# Patient Record
Sex: Male | Born: 1955 | Race: Black or African American | Hispanic: No | Marital: Married | Smoking: Never smoker
Health system: Southern US, Community
[De-identification: ages and names within clinical notes are randomized; demographics above are authoritative.]

## PROBLEM LIST (undated history)

## (undated) DIAGNOSIS — C801 Malignant (primary) neoplasm, unspecified: Secondary | ICD-10-CM

## (undated) DIAGNOSIS — T7840XA Allergy, unspecified, initial encounter: Secondary | ICD-10-CM

## (undated) DIAGNOSIS — M1990 Unspecified osteoarthritis, unspecified site: Secondary | ICD-10-CM

## (undated) DIAGNOSIS — I10 Essential (primary) hypertension: Secondary | ICD-10-CM

## (undated) DIAGNOSIS — E7800 Pure hypercholesterolemia, unspecified: Secondary | ICD-10-CM

## (undated) HISTORY — DX: Malignant (primary) neoplasm, unspecified: C80.1

## (undated) HISTORY — DX: Essential (primary) hypertension: I10

## (undated) HISTORY — DX: Allergy, unspecified, initial encounter: T78.40XA

## (undated) HISTORY — PX: PROSTATECTOMY: SHX69

---

## 2004-02-02 ENCOUNTER — Ambulatory Visit: Payer: Self-pay | Admitting: Internal Medicine

## 2004-05-03 ENCOUNTER — Ambulatory Visit: Payer: Self-pay | Admitting: Internal Medicine

## 2004-10-18 ENCOUNTER — Ambulatory Visit: Payer: Self-pay | Admitting: Internal Medicine

## 2005-04-20 ENCOUNTER — Ambulatory Visit: Payer: Self-pay | Admitting: Internal Medicine

## 2005-07-18 ENCOUNTER — Ambulatory Visit: Payer: Self-pay | Admitting: Internal Medicine

## 2006-01-09 ENCOUNTER — Ambulatory Visit: Payer: Self-pay | Admitting: Internal Medicine

## 2006-04-04 ENCOUNTER — Ambulatory Visit: Payer: Self-pay | Admitting: Internal Medicine

## 2006-07-10 ENCOUNTER — Ambulatory Visit: Payer: Self-pay | Admitting: Internal Medicine

## 2006-07-10 LAB — CONVERTED CEMR LAB
AST: 28 units/L (ref 0–37)
Albumin: 3.8 g/dL (ref 3.5–5.2)
BUN: 13 mg/dL (ref 6–23)
Basophils Absolute: 0 10*3/uL (ref 0.0–0.1)
Basophils Relative: 0.7 % (ref 0.0–1.0)
Bilirubin, Direct: 0.1 mg/dL (ref 0.0–0.3)
CO2: 33 meq/L — ABNORMAL HIGH (ref 19–32)
Cholesterol: 236 mg/dL (ref 0–200)
Creatinine, Ser: 1 mg/dL (ref 0.4–1.5)
Eosinophils Absolute: 0.3 10*3/uL (ref 0.0–0.6)
Eosinophils Relative: 6.4 % — ABNORMAL HIGH (ref 0.0–5.0)
GFR calc Af Amer: 102 mL/min
Glucose, Bld: 124 mg/dL — ABNORMAL HIGH (ref 70–99)
Lymphocytes Relative: 48.3 % — ABNORMAL HIGH (ref 12.0–46.0)
Monocytes Relative: 10.3 % (ref 3.0–11.0)
Neutrophils Relative %: 34.3 % — ABNORMAL LOW (ref 43.0–77.0)
RBC: 5.09 M/uL (ref 4.22–5.81)
Sodium: 142 meq/L (ref 135–145)
TSH: 1.79 microintl units/mL (ref 0.35–5.50)
Total CHOL/HDL Ratio: 5.6
Triglycerides: 124 mg/dL (ref 0–149)
VLDL: 25 mg/dL (ref 0–40)

## 2006-08-16 ENCOUNTER — Ambulatory Visit: Payer: Self-pay | Admitting: Internal Medicine

## 2006-09-11 ENCOUNTER — Ambulatory Visit: Payer: Self-pay | Admitting: Internal Medicine

## 2006-09-25 ENCOUNTER — Ambulatory Visit: Payer: Self-pay | Admitting: Internal Medicine

## 2007-02-15 DIAGNOSIS — J309 Allergic rhinitis, unspecified: Secondary | ICD-10-CM | POA: Insufficient documentation

## 2007-02-15 DIAGNOSIS — E785 Hyperlipidemia, unspecified: Secondary | ICD-10-CM | POA: Insufficient documentation

## 2007-02-15 DIAGNOSIS — I10 Essential (primary) hypertension: Secondary | ICD-10-CM | POA: Insufficient documentation

## 2007-02-15 DIAGNOSIS — K5730 Diverticulosis of large intestine without perforation or abscess without bleeding: Secondary | ICD-10-CM | POA: Insufficient documentation

## 2007-04-04 ENCOUNTER — Ambulatory Visit: Payer: Self-pay | Admitting: Internal Medicine

## 2007-04-11 ENCOUNTER — Ambulatory Visit: Payer: Self-pay | Admitting: Internal Medicine

## 2007-04-11 DIAGNOSIS — M109 Gout, unspecified: Secondary | ICD-10-CM | POA: Insufficient documentation

## 2007-04-11 LAB — CONVERTED CEMR LAB: Uric Acid, Serum: 7.9 mg/dL — ABNORMAL HIGH (ref 2.4–7.0)

## 2007-05-06 ENCOUNTER — Telehealth: Payer: Self-pay | Admitting: Internal Medicine

## 2008-05-04 ENCOUNTER — Ambulatory Visit: Payer: Self-pay | Admitting: Internal Medicine

## 2008-05-04 DIAGNOSIS — Z8719 Personal history of other diseases of the digestive system: Secondary | ICD-10-CM | POA: Insufficient documentation

## 2008-07-21 ENCOUNTER — Ambulatory Visit: Payer: Self-pay | Admitting: Family Medicine

## 2008-07-21 DIAGNOSIS — L538 Other specified erythematous conditions: Secondary | ICD-10-CM | POA: Insufficient documentation

## 2008-08-25 ENCOUNTER — Ambulatory Visit: Payer: Self-pay | Admitting: Internal Medicine

## 2008-08-25 LAB — CONVERTED CEMR LAB
ALT: 29 units/L (ref 0–53)
BUN: 11 mg/dL (ref 6–23)
Basophils Absolute: 0 10*3/uL (ref 0.0–0.1)
Bilirubin Urine: NEGATIVE
Blood in Urine, dipstick: NEGATIVE
Calcium: 9.5 mg/dL (ref 8.4–10.5)
Chloride: 103 meq/L (ref 96–112)
Creatinine, Ser: 1 mg/dL (ref 0.4–1.5)
GFR calc non Af Amer: 100.86 mL/min (ref >60)
Glucose, Bld: 141 mg/dL — ABNORMAL HIGH (ref 70–99)
HCT: 40.3 % (ref 39.0–52.0)
HDL: 39 mg/dL — ABNORMAL LOW (ref >39.00)
Lymphocytes Relative: 46.3 % — ABNORMAL HIGH (ref 12.0–46.0)
Lymphs Abs: 1.9 10*3/uL (ref 0.7–4.0)
MCHC: 34.3 g/dL (ref 30.0–36.0)
MCV: 84.1 fL (ref 78.0–100.0)
Monocytes Absolute: 0.3 10*3/uL (ref 0.1–1.0)
Neutro Abs: 1.6 10*3/uL (ref 1.4–7.7)
Neutrophils Relative %: 40 % — ABNORMAL LOW (ref 43.0–77.0)
PSA: 4.27 ng/mL — ABNORMAL HIGH (ref 0.10–4.00)
RBC: 4.8 M/uL (ref 4.22–5.81)
RDW: 14.7 % — ABNORMAL HIGH (ref 11.5–14.6)
Total Protein: 7.8 g/dL (ref 6.0–8.3)
Urobilinogen, UA: 0.2
WBC: 4 10*3/uL — ABNORMAL LOW (ref 4.5–10.5)

## 2008-09-01 ENCOUNTER — Ambulatory Visit: Payer: Self-pay | Admitting: Internal Medicine

## 2008-09-01 DIAGNOSIS — R972 Elevated prostate specific antigen [PSA]: Secondary | ICD-10-CM | POA: Insufficient documentation

## 2008-09-17 ENCOUNTER — Encounter: Payer: Self-pay | Admitting: Internal Medicine

## 2008-10-13 ENCOUNTER — Ambulatory Visit: Payer: Self-pay | Admitting: Internal Medicine

## 2008-10-26 ENCOUNTER — Encounter: Payer: Self-pay | Admitting: Internal Medicine

## 2008-11-11 ENCOUNTER — Encounter: Payer: Self-pay | Admitting: Internal Medicine

## 2008-12-24 ENCOUNTER — Encounter (INDEPENDENT_AMBULATORY_CARE_PROVIDER_SITE_OTHER): Payer: Self-pay | Admitting: Urology

## 2008-12-24 ENCOUNTER — Inpatient Hospital Stay (HOSPITAL_COMMUNITY): Admission: RE | Admit: 2008-12-24 | Discharge: 2008-12-25 | Payer: Self-pay | Admitting: Urology

## 2009-01-01 ENCOUNTER — Encounter: Payer: Self-pay | Admitting: Internal Medicine

## 2009-02-17 ENCOUNTER — Encounter (INDEPENDENT_AMBULATORY_CARE_PROVIDER_SITE_OTHER): Payer: Self-pay | Admitting: *Deleted

## 2009-02-22 ENCOUNTER — Ambulatory Visit: Payer: Self-pay | Admitting: Internal Medicine

## 2009-02-22 LAB — CONVERTED CEMR LAB
Blood Glucose, Fingerstick: 96
Hgb A1c MFr Bld: 6.5 % (ref 4.6–6.5)

## 2009-05-24 ENCOUNTER — Encounter: Payer: Self-pay | Admitting: Internal Medicine

## 2009-06-08 ENCOUNTER — Ambulatory Visit: Payer: Self-pay | Admitting: Internal Medicine

## 2009-06-08 DIAGNOSIS — M549 Dorsalgia, unspecified: Secondary | ICD-10-CM | POA: Insufficient documentation

## 2009-06-08 LAB — CONVERTED CEMR LAB: Blood Glucose, Fingerstick: 108

## 2009-06-23 ENCOUNTER — Encounter: Payer: Self-pay | Admitting: Internal Medicine

## 2009-08-24 ENCOUNTER — Ambulatory Visit: Payer: Self-pay | Admitting: Internal Medicine

## 2009-08-24 DIAGNOSIS — M545 Low back pain, unspecified: Secondary | ICD-10-CM | POA: Insufficient documentation

## 2009-08-24 DIAGNOSIS — E119 Type 2 diabetes mellitus without complications: Secondary | ICD-10-CM | POA: Insufficient documentation

## 2009-08-24 DIAGNOSIS — K439 Ventral hernia without obstruction or gangrene: Secondary | ICD-10-CM | POA: Insufficient documentation

## 2009-08-26 ENCOUNTER — Encounter: Admission: RE | Admit: 2009-08-26 | Discharge: 2009-08-26 | Payer: Self-pay | Admitting: Internal Medicine

## 2009-09-01 ENCOUNTER — Telehealth: Payer: Self-pay | Admitting: Internal Medicine

## 2009-09-03 ENCOUNTER — Encounter: Admission: RE | Admit: 2009-09-03 | Discharge: 2009-09-03 | Payer: Self-pay | Admitting: Internal Medicine

## 2009-09-06 ENCOUNTER — Telehealth: Payer: Self-pay | Admitting: Internal Medicine

## 2009-11-22 ENCOUNTER — Ambulatory Visit: Payer: Self-pay | Admitting: Internal Medicine

## 2009-11-22 LAB — CONVERTED CEMR LAB
Blood Glucose, Fingerstick: 126
Hgb A1c MFr Bld: 7.1 % — ABNORMAL HIGH (ref 4.6–6.5)

## 2009-12-22 ENCOUNTER — Encounter: Payer: Self-pay | Admitting: Internal Medicine

## 2010-02-22 ENCOUNTER — Ambulatory Visit: Payer: Self-pay | Admitting: Internal Medicine

## 2010-02-24 LAB — CONVERTED CEMR LAB: Hgb A1c MFr Bld: 7.3 % — ABNORMAL HIGH (ref 4.6–6.5)

## 2010-04-12 NOTE — Progress Notes (Signed)
Summary: wants sedative -rx for lorazepam  Phone Note Call from Patient Call back at Work Phone [phone]   Caller: Patient---live call Reason for Call: Talk to Doctor Summary of Call: Having a MRI on this Friday morning. he stated that he is claustrophobic and  will need a sedative to help him relax. Please call in a rx to [facility] on [address]. Please call pt when done. Initial call taken by: Warnell Forester,  September 01, 2009 10:33 AM  Follow-up for Phone Call        lorazepam 1 mg  #3  Take 30 minutes (one) prior to procedure Follow-up by: Gordy Savers  MD,  September 01, 2009 12:20 PM  Additional Follow-up for Phone Call Additional follow up Details #1::        med rx'd to [facility] - pt aware how to take. KIK Additional Follow-up by: Duard Brady LPN,  September 01, 2009 1:01 PM    New/Updated Medications: LORAZEPAM 1 MG TABS (LORAZEPAM) 1 by mouth 30 mins before procsdure Prescriptions: LORAZEPAM 1 MG TABS (LORAZEPAM) 1 by mouth 30 mins before procsdure  #3 x 0   Entered by:   Duard Brady LPN   Authorized by:   Gordy Savers  MD   Signed by:   Duard Brady LPN on 25/95/6387   Method used:   Historical   RxID:   [id number]

## 2010-04-12 NOTE — Assessment & Plan Note (Signed)
Summary: 3 month rov/nrj   Vital Signs:  Patient profile:   56 year old male Weight:      245 pounds Temp:     98.1 degrees F oral BP sitting:   120 / 84  (left arm) Cuff size:   regular  Vitals Entered By: Duard Brady LPN (November 22, 2009 12:38 PM) CC: 3 mos rov - doing ok Is Patient Diabetic? Yes Did you bring your meter with you today? No CBG Result 126 Flu Vaccine Consent Questions     Do you have a history of severe allergic reactions to this vaccine? no    Any prior history of allergic reactions to egg and/or gelatin? no    Do you have a sensitivity to the preservative Thimersol? no    Do you have a past history of Guillan-Barre Syndrome? no    Do you currently have an acute febrile illness? no    Have you ever had a severe reaction to latex? no    Vaccine information given and explained to patient? yes    Are you currently pregnant? no    Lot Number:[id number]   Exp Date:09/10/2010   Site Given  Left Deltoid IM   CC:  3 mos rov - doing ok.  History of Present Illness: 55 year old patient who is seen today for follow-up of type 2 diabetes, hypertension, dyslipidemia, and through remains on Crestor 40 mg daily.  Three months ago.  He presented with right back pain.  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  His diabetes remained stable in spite of modest weight gain.  His blood pressure remained stable.  No further back pain  Allergies (verified): No Known Drug Allergies  Past History:  Past Medical History: Reviewed history from 08/24/2009 and no changes required. Allergic rhinitis Hyperlipidemia Hypertension Diverticulosis, colon hypokalemia Elevated PSA/prostate cancer Low back pain Diabetes mellitus, type II  Past Surgical History: Reviewed history from 02/22/2009 and no changes required. Denies surgical history colonoscopy 2008 radical prostatectomy October 2010  Review of Systems       The patient complains of weight  gain.  The patient denies anorexia, fever, weight loss, vision loss, decreased hearing, hoarseness, chest pain, syncope, dyspnea on exertion, peripheral edema, prolonged cough, headaches, hemoptysis, abdominal pain, melena, hematochezia, severe indigestion/heartburn, hematuria, incontinence, genital sores, muscle weakness, suspicious skin lesions, transient blindness, difficulty walking, depression, unusual weight change, abnormal bleeding, enlarged lymph nodes, angioedema, breast masses, and testicular masses.    Physical Exam  General:  overweight-appearing.  120/76overweight-appearing.   Head:  Normocephalic and atraumatic without obvious abnormalities. No apparent alopecia or balding. Eyes:  No corneal or conjunctival inflammation noted. EOMI. Perrla. Funduscopic exam benign, without hemorrhages, exudates or papilledema. Vision grossly normal. Mouth:  Oral mucosa and oropharynx without lesions or exudates.  Teeth in good repair. Neck:  No deformities, masses, or tenderness noted. Lungs:  Normal respiratory effort, chest expands symmetrically. Lungs are clear to auscultation, no crackles or wheezes. Heart:  Normal rate and regular rhythm. S1 and S2 normal without gallop, murmur, click, rub or other extra sounds. Abdomen:  Bowel sounds positive,abdomen soft and non-tender without masses, organomegaly or hernias noted. Msk:  No deformity or scoliosis noted of thoracic or lumbar spine.   Pulses:  R and L carotid,radial,femoral,dorsalis pedis and posterior tibial pulses are full and equal bilaterally  Diabetes Management Exam:    Foot Exam (with socks and/or shoes not present):       Sensory-Pinprick/Light touch:  Left medial foot (L-4): normal          Left dorsal foot (L-5): normal          Left lateral foot (S-1): normal          Right medial foot (L-4): normal          Right dorsal foot (L-5): normal          Right lateral foot (S-1): normal       Sensory-Monofilament:           Left foot: normal          Right foot: normal       Inspection:          Left foot: normal          Right foot: normal       Nails:          Left foot: normal          Right foot: normal    Foot Exam by Podiatrist:       Date: 11/22/2009       Results: no diabetic findings       Done by: PCP   Impression & Recommendations:  Problem # 1:  DIABETES MELLITUS, TYPE II (ICD-250.00)  His updated medication list for this problem includes:    Benazepril-hydrochlorothiazide 20-25 Mg Tabs (Benazepril-hydrochlorothiazide) ..... One daily    Metformin Hcl 500 Mg Tabs (Metformin hcl) ..... One twice daily  Orders: Capillary Blood Glucose/CBG (74259)  His updated medication list for this problem includes:    Benazepril-hydrochlorothiazide 20-25 Mg Tabs (Benazepril-hydrochlorothiazide) ..... One daily    Metformin Hcl 500 Mg Tabs (Metformin hcl) ..... One twice daily  Problem # 2:  HYPERLIPIDEMIA (ICD-272.4)  His updated medication list for this problem includes:    Crestor 40 Mg Tabs (Rosuvastatin calcium) ..... One daily  His updated medication list for this problem includes:    Crestor 40 Mg Tabs (Rosuvastatin calcium) ..... One daily  Problem # 3:  HYPERTENSION (ICD-401.9)  His updated medication list for this problem includes:    Amlodipine Besylate 10 Mg Tabs (Amlodipine besylate) ..... One daily    Benazepril-hydrochlorothiazide 20-25 Mg Tabs (Benazepril-hydrochlorothiazide) ..... One daily  His updated medication list for this problem includes:    Amlodipine Besylate 10 Mg Tabs (Amlodipine besylate) ..... One daily    Benazepril-hydrochlorothiazide 20-25 Mg Tabs (Benazepril-hydrochlorothiazide) ..... One daily  Complete Medication List: 1)  Klor-con M20 20 Meq Tbcr (Potassium chloride crys cr) .... 2 once daily 2)  Flonase 50 Mcg/act Susp (Fluticasone propionate) .... Use once daily as needed 3)  Amlodipine Besylate 10 Mg Tabs (Amlodipine besylate) .... One daily 4)   Colchicine 0.6 Mg Tabs (Colchicine) .... One three times daily with meals 5)  Benazepril-hydrochlorothiazide 20-25 Mg Tabs (Benazepril-hydrochlorothiazide) .... One daily 6)  Metformin Hcl 500 Mg Tabs (Metformin hcl) .... One twice daily 7)  Crestor 40 Mg Tabs (Rosuvastatin calcium) .... One daily 8)  Cialis 5 Mg Tabs (Tadalafil) .... Take as dir 9)  Diclofenac Sodium 75 Mg Tbec (Diclofenac sodium) .... One twice daily as needed for low back pain 10)  Cyclobenzaprine Hcl 10 Mg Tabs (Cyclobenzaprine hcl) .... One at bedtime 11)  Hydrocodone-acetaminophen 5-500 Mg Tabs (Hydrocodone-acetaminophen) .... One every 6 hours as needed for pain  Other Orders: Admin 1st Vaccine (56387) Flu Vaccine 61yrs + (56433) Venipuncture (29518) TLB-A1C / Hgb A1C (Glycohemoglobin) (83036-A1C) Specimen Handling (84166)  Patient Instructions: 1)  Please schedule a follow-up appointment  in 3 months. 2)  It is important that you exercise regularly at least 20 minutes 5 times a week. If you develop chest pain, have severe difficulty breathing, or feel very tired , stop exercising immediately and seek medical attention. 3)  You need to lose weight. Consider a lower calorie diet and regular exercise.  4)  Check your blood sugars regularly. If your readings are usually above : or below 70 you should contact our office. 5)  It is important that your Diabetic A1c level is checked every 3 months. 6)  See your eye doctor yearly to check for diabetic eye damage.

## 2010-04-12 NOTE — Letter (Signed)
Summary: [facility]  [facility]   Imported By: Maryln Gottron 01/03/2010 15:25:32  _____________________________________________________________________  External Attachment:    Type:   Image     Comment:   External Document

## 2010-04-12 NOTE — Assessment & Plan Note (Signed)
Summary: EVAL OF POSSIBLE HERNIA // RS  I and a in the he is a  Vital Signs:  Patient profile:   55 year old male Weight:      239 pounds Temp:     98.2 degrees F oral BP sitting:   110 / 70  (left arm) Cuff size:   regular  Vitals Entered By: Duard Brady LPN (August 24, 2009 3:55 PM) CC: c/o back pain , ?abd hernia Is Patient Diabetic? Yes Did you bring your meter with you today? No   CC:  c/o back pain  and ?abd hernia.  History of Present Illness: 55 year old patient who is seen today for follow-up of low back pain.  He has had this for approximately 3 months.  He has had some intermittent pain also in the past.  Pain is primarily right-sided without much radicular component.  He has a new and some gentle stretching and back exercises without benefit.  He is been on anti-inflammatory medicines for 3 months .He also complains of a small periumbilical hernia that has become slightly more painful. He has diabetes and hypertension, which have been stable  Allergies (verified): No Known Drug Allergies  Past History:  Past Medical History: Allergic rhinitis Hyperlipidemia Hypertension Diverticulosis, colon hypokalemia Elevated PSA/prostate cancer Low back pain Diabetes mellitus, type II  Review of Systems       The patient complains of abdominal pain.  The patient denies anorexia, fever, weight loss, weight gain, vision loss, decreased hearing, hoarseness, chest pain, syncope, dyspnea on exertion, peripheral edema, prolonged cough, headaches, hemoptysis, melena, hematochezia, severe indigestion/heartburn, hematuria, incontinence, genital sores, muscle weakness, suspicious skin lesions, transient blindness, difficulty walking, depression, unusual weight change, abnormal bleeding, enlarged lymph nodes, angioedema, breast masses, and testicular masses.    Physical Exam  General:  overweight-appearing.  blood pressure nicely controlledoverweight-appearing.   Eyes:  No  corneal or conjunctival inflammation noted. EOMI. Perrla. Funduscopic exam benign, without hemorrhages, exudates or papilledema. Vision grossly normal. Abdomen:  a small midline abdominal hernia noted just cephalad to the umbilical area Msk:  slight tenderness in the right lumbar region.  Straight leg testing was negative.  Reflexes and motor examination, unremarkable  Diabetes Management Exam:    Eye Exam:       Eye Exam done here today          Results: normal   Impression & Recommendations:  Problem # 1:  DIABETES MELLITUS, TYPE II (ICD-250.00)  His updated medication list for this problem includes:    Benazepril-hydrochlorothiazide 20-25 Mg Tabs (Benazepril-hydrochlorothiazide) ..... One daily    Metformin Hcl 500 Mg Tabs (Metformin hcl) ..... One twice daily  His updated medication list for this problem includes:    Benazepril-hydrochlorothiazide 20-25 Mg Tabs (Benazepril-hydrochlorothiazide) ..... One daily    Metformin Hcl 500 Mg Tabs (Metformin hcl) ..... One twice daily  Problem # 2:  LOW BACK PAIN (ICD-724.2)  His updated medication list for this problem includes:    Diclofenac Sodium 75 Mg Tbec (Diclofenac sodium) ..... One twice daily as needed for low back pain    Cyclobenzaprine Hcl 10 Mg Tabs (Cyclobenzaprine hcl) ..... One at bedtime    Hydrocodone-acetaminophen 5-500 Mg Tabs (Hydrocodone-acetaminophen) ..... One every 6 hours as needed for pain    His updated medication list for this problem includes:    Diclofenac Sodium 75 Mg Tbec (Diclofenac sodium) ..... One twice daily as needed for low back pain    Cyclobenzaprine Hcl 10 Mg Tabs (Cyclobenzaprine hcl) .Marland KitchenMarland KitchenMarland KitchenMarland Kitchen  One at bedtime    Hydrocodone-acetaminophen 5-500 Mg Tabs (Hydrocodone-acetaminophen) ..... One every 6 hours as needed for pain  Orders: Radiology Referral (Radiology)  Problem # 3:  HYPERTENSION (ICD-401.9)  His updated medication list for this problem includes:    Amlodipine Besylate 10 Mg Tabs  (Amlodipine besylate) ..... One daily    Benazepril-hydrochlorothiazide 20-25 Mg Tabs (Benazepril-hydrochlorothiazide) ..... One daily  His updated medication list for this problem includes:    Amlodipine Besylate 10 Mg Tabs (Amlodipine besylate) ..... One daily    Benazepril-hydrochlorothiazide 20-25 Mg Tabs (Benazepril-hydrochlorothiazide) ..... One daily  Problem # 4:  VENTRAL HERNIA (ICD-553.20)  Complete Medication List: 1)  Klor-con M20 20 Meq Tbcr (Potassium chloride crys cr) .... 2 once daily 2)  Flonase 50 Mcg/act Susp (Fluticasone propionate) .... Use once daily as needed 3)  Amlodipine Besylate 10 Mg Tabs (Amlodipine besylate) .... One daily 4)  Colchicine 0.6 Mg Tabs (Colchicine) .... One three times daily with meals 5)  Benazepril-hydrochlorothiazide 20-25 Mg Tabs (Benazepril-hydrochlorothiazide) .... One daily 6)  Metformin Hcl 500 Mg Tabs (Metformin hcl) .... One twice daily 7)  Crestor 40 Mg Tabs (Rosuvastatin calcium) .... One daily 8)  Cialis 5 Mg Tabs (Tadalafil) .... Take as dir 9)  Diclofenac Sodium 75 Mg Tbec (Diclofenac sodium) .... One twice daily as needed for low back pain 10)  Cyclobenzaprine Hcl 10 Mg Tabs (Cyclobenzaprine hcl) .... One at bedtime 11)  Hydrocodone-acetaminophen 5-500 Mg Tabs (Hydrocodone-acetaminophen) .... One every 6 hours as needed for pain  Patient Instructions: 1)  Please schedule a follow-up appointment in 3 months. 2)  Limit your Sodium (Salt). 3)  lumbar MRI as scheduled 4)  call if  abdominal pain, or hernia worsens Prescriptions: HYDROCODONE-ACETAMINOPHEN 5-500 MG TABS (HYDROCODONE-ACETAMINOPHEN) one every 6 hours as needed for pain  #50 x 2   Entered and Authorized by:   Gordy Savers  MD   Signed by:   Gordy Savers  MD on 08/24/2009   Method used:   Print then Give to Patient   RxID:   [id number] CYCLOBENZAPRINE HCL 10 MG TABS (CYCLOBENZAPRINE HCL) one at bedtime  #50 x 2   Entered and Authorized by:   Gordy Savers  MD   Signed by:   Gordy Savers  MD on 08/24/2009   Method used:   Print then Give to Patient   RxID:   [id number]

## 2010-04-12 NOTE — Letter (Signed)
Summary: [facility]  [facility]   Imported By: Maryln Gottron 07/06/2009 14:50:24  _____________________________________________________________________  External Attachment:    Type:   Image     Comment:   External Document

## 2010-04-12 NOTE — Assessment & Plan Note (Signed)
Summary: 4 month fup//ccm--PT [facility] //RS   Vital Signs:  Patient profile:   55 year old male Weight:      235 pounds Temp:     98.1 degrees F oral BP sitting:   106 / 80  (right arm) Cuff size:   large  Vitals Entered By: Duard Brady LPN (June 08, 2009 10:45 AM) CC: c/o lower back pain , no fall or injury Is Patient Diabetic? Yes Did you bring your meter with you today? No CBG Result 108   CC:  c/o lower back pain  and no fall or injury.  History of Present Illness: a 55 year old patient who is seen today with a two week history of low back pain.  This is aggravated by standing from a sitting position and movement.  No radicular symptoms.  No prior history of low back pain.  He is seen today also for follow-up of his hypertension, dyslipidemia, and type 2 diabetes.  Otherwise, is doing quite well.  A random blood sugar today 108, blood pressure has well-controlled.  He remains on Crestor 40 milligrams daily, which he continues to tolerate without difficulty.  He is a history of gout, which has been stable.  He is status post prostatectomy last year  Preventive Screening-Counseling & Management  Alcohol-Tobacco     Smoking Status: never  Allergies (verified): No Known Drug Allergies  Past History:  Past Medical History: Allergic rhinitis Hyperlipidemia Hypertension Diverticulosis, colon hypokalemia IGT Elevated PSA/prostate cancer  Past Surgical History: Reviewed history from 02/22/2009 and no changes required. Denies surgical history colonoscopy 2008 radical prostatectomy October 2010  Family History: Reviewed history from 09/01/2008 and no changes required. Father Age 39:  DM, HTN Mother:  age 16- HTN  5 [place]- Died Pneumonia (1); HTN 1 Sister- HTN  Review of Systems       The patient complains of weight gain.  The patient denies anorexia, fever, weight loss, vision loss, decreased hearing, hoarseness, chest pain, syncope, dyspnea on exertion,  peripheral edema, prolonged cough, headaches, hemoptysis, abdominal pain, melena, hematochezia, severe indigestion/heartburn, hematuria, incontinence, genital sores, muscle weakness, suspicious skin lesions, transient blindness, difficulty walking, depression, unusual weight change, abnormal bleeding, enlarged lymph nodes, angioedema, breast masses, and testicular masses.    Physical Exam  General:  overweight-appearing.  120/76 Head:  Normocephalic and atraumatic without obvious abnormalities. No apparent alopecia or balding. Eyes:  No corneal or conjunctival inflammation noted. EOMI. Perrla. Funduscopic exam benign, without hemorrhages, exudates or papilledema. Vision grossly normal. Mouth:  Oral mucosa and oropharynx without lesions or exudates.  Teeth in good repair. Neck:  No deformities, masses, or tenderness noted. Lungs:  Normal respiratory effort, chest expands symmetrically. Lungs are clear to auscultation, no crackles or wheezes. Heart:  Normal rate and regular rhythm. S1 and S2 normal without gallop, murmur, click, rub or other extra sounds. Abdomen:  Bowel sounds positive,abdomen soft and non-tender without masses, organomegaly or hernias noted. Msk:  no lumbar tenderness.  Straight leg testing negative.  Neurovascular structures intact Pulses:  R and L carotid,radial,femoral,dorsalis pedis and posterior tibial pulses are full and equal bilaterally Extremities:  No clubbing, cyanosis, edema, or deformity noted with normal full range of motion of all joints.     Impression & Recommendations:  Problem # 1:  BACK PAIN (ICD-724.5)  His updated medication list for this problem includes:    Diclofenac Sodium 75 Mg Tbec (Diclofenac sodium) ..... One twice daily as needed for low back pain  Problem # 2:  IMPAIRED  GLUCOSE TOLERANCE (ICD-271.3)  Orders: Venipuncture (30865) TLB-A1C / Hgb A1C (Glycohemoglobin) (83036-A1C)  Problem # 3:  HYPERTENSION (ICD-401.9)  His updated  medication list for this problem includes:    Amlodipine Besylate 10 Mg Tabs (Amlodipine besylate) ..... One daily    Benazepril-hydrochlorothiazide 20-25 Mg Tabs (Benazepril-hydrochlorothiazide) ..... One daily  Complete Medication List: 1)  Klor-con M20 20 Meq Tbcr (Potassium chloride crys cr) .... 2 once daily 2)  Flonase 50 Mcg/act Susp (Fluticasone propionate) .... Use once daily as needed 3)  Amlodipine Besylate 10 Mg Tabs (Amlodipine besylate) .... One daily 4)  Colchicine 0.6 Mg Tabs (Colchicine) .... One three times daily with meals 5)  Lotrisone 1-0.05 % Crea (Clotrimazole-betamethasone) .... Apply to affected rash two times a day as needed 6)  Benazepril-hydrochlorothiazide 20-25 Mg Tabs (Benazepril-hydrochlorothiazide) .... One daily 7)  Metformin Hcl 500 Mg Tabs (Metformin hcl) .... One twice daily 8)  Crestor 40 Mg Tabs (Rosuvastatin calcium) .... One daily 9)  Cialis 5 Mg Tabs (Tadalafil) .... Take as dir 10)  Diclofenac Sodium 75 Mg Tbec (Diclofenac sodium) .... One twice daily as needed for low back pain  Other Orders: Capillary Blood Glucose/CBG (78469)  Patient Instructions: 1)  Please schedule a follow-up appointment in 4 months. 2)  Limit your Sodium (Salt). 3)  It is important that you exercise regularly at least 20 minutes 5 times a week. If you develop chest pain, have severe difficulty breathing, or feel very tired , stop exercising immediately and seek medical attention. 4)  You need to lose weight. Consider a lower calorie diet and regular exercise.  5)  Check your blood sugars regularly. If your readings are usually above : or below 70 you should contact our office. 6)  It is important that your Diabetic A1c level is checked every 3 months. 7)  See your eye doctor yearly to check for diabetic eye damage. 8)  Most patients (90%) with low back pain will improve with time (2-6 weeks). Keep active but avoid activities that are painful. Apply moist heat  to lower  back several times a day. Prescriptions: DICLOFENAC SODIUM 75 MG TBEC (DICLOFENAC SODIUM) one twice daily as needed for low back pain  #50 x 2   Entered and Authorized by:   Gordy Savers  MD   Signed by:   Gordy Savers  MD on 06/08/2009   Method used:   Print then Mail to Patient   RxID:   [id number]

## 2010-04-12 NOTE — Progress Notes (Signed)
Summary: MRI results  Phone Note Call from Patient   Caller: Patient Call For: Gordy Savers  MD Summary of Call: Pt is calling for MRI results. [phone] Initial call taken by: Lynann Beaver CMA,  September 06, 2009 12:49 PM  Follow-up for Phone Call        called Follow-up by: Gordy Savers  MD,  September 06, 2009 1:02 PM

## 2010-04-12 NOTE — Letter (Signed)
Summary: Eye Examination/[facility]  Eye Examination/[facility]   Imported By: Maryln Gottron 05/26/2009 13:01:06  _____________________________________________________________________  External Attachment:    Type:   Image     Comment:   External Document

## 2010-04-14 NOTE — Assessment & Plan Note (Signed)
Summary: ROA/FUP/A1C/RCD---PT [facility] // RS   Vital Signs:  Patient profile:   55 year old male Weight:      248 pounds Temp:     98.4 degrees F oral BP sitting:   124 / 78  (left arm) Cuff size:   regular  Vitals Entered By: Duard Brady LPN (February 24, 2010 8:18 AM) CC: rov- doing ok Is Patient Diabetic? Yes CBG Result 149   CC:  rov- doing ok.  History of Present Illness:  55 year old patient who is seen today for follow-up of his type 2 diabetes and hypertension.  He is followed by urology.  Status post radical prostatectomy.  Assessment PSAs have been nondetectable.  He also has dyslipidemia.  No concerns or complaints today.  His last hemoglobin A1c7.1.  There has been some modest weight gain since his last visit here. Medical regimen includes Crestor 40 mg daily, which she continues to tolerate well  Allergies (verified): No Known Drug Allergies  Past History:  Past Medical History: Reviewed history from 08/24/2009 and no changes required. Allergic rhinitis Hyperlipidemia Hypertension Diverticulosis, colon hypokalemia Elevated PSA/prostate cancer Low back pain Diabetes mellitus, type II  Past Surgical History: Reviewed history from 02/22/2009 and no changes required. Denies surgical history colonoscopy 2008 radical prostatectomy October 2010  Review of Systems       The patient complains of weight gain.  The patient denies anorexia, fever, weight loss, vision loss, decreased hearing, hoarseness, chest pain, syncope, dyspnea on exertion, peripheral edema, prolonged cough, headaches, hemoptysis, abdominal pain, melena, hematochezia, severe indigestion/heartburn, hematuria, incontinence, genital sores, muscle weakness, suspicious skin lesions, transient blindness, difficulty walking, depression, unusual weight change, abnormal bleeding, enlarged lymph nodes, angioedema, breast masses, and testicular masses.    Physical Exam  General:  overweight-appearing.   124/76overweight-appearing.   Head:  Normocephalic and atraumatic without obvious abnormalities. No apparent alopecia or balding. Eyes:  No corneal or conjunctival inflammation noted. EOMI. Perrla. Funduscopic exam benign, without hemorrhages, exudates or papilledema. Vision grossly normal. Mouth:  Oral mucosa and oropharynx without lesions or exudates.  Teeth in good repair. Neck:  No deformities, masses, or tenderness noted. Lungs:  Normal respiratory effort, chest expands symmetrically. Lungs are clear to auscultation, no crackles or wheezes. Heart:  Normal rate and regular rhythm. S1 and S2 normal without gallop, murmur, click, rub or other extra sounds. Abdomen:  Bowel sounds positive,abdomen soft and non-tender without masses, organomegaly or hernias noted. Msk:  No deformity or scoliosis noted of thoracic or lumbar spine.   Pulses:  R and L carotid,radial,femoral,dorsalis pedis and posterior tibial pulses are full and equal bilaterally Extremities:  No clubbing, cyanosis, edema, or deformity noted with normal full range of motion of all joints.     Impression & Recommendations:  Problem # 1:  DIABETES MELLITUS, TYPE II (ICD-250.00)  The following medications were removed from the medication list:    Metformin Hcl 500 Mg Tabs (Metformin hcl) ..... One twice daily His updated medication list for this problem includes:    Benazepril-hydrochlorothiazide 20-25 Mg Tabs (Benazepril-hydrochlorothiazide) ..... One daily    Metformin Hcl 500 Mg Xr24h-tab (Metformin hcl) .Marland Kitchen..Marland Kitchen Two daily  Orders: Capillary Blood Glucose/CBG (04540) Venipuncture (98119) TLB-A1C / Hgb A1C (Glycohemoglobin) (83036-A1C) Specimen Handling (14782)  The following medications were removed from the medication list:    Metformin Hcl 500 Mg Tabs (Metformin hcl) ..... One twice daily His updated medication list for this problem includes:    Benazepril-hydrochlorothiazide 20-25 Mg Tabs (Benazepril-hydrochlorothiazide)  ..... One daily  Metformin Hcl 500 Mg Xr24h-tab (Metformin hcl) .Marland Kitchen..Marland Kitchen Two daily  Problem # 2:  HYPERTENSION (ICD-401.9)  His updated medication list for this problem includes:    Amlodipine Besylate 10 Mg Tabs (Amlodipine besylate) ..... One daily    Benazepril-hydrochlorothiazide 20-25 Mg Tabs (Benazepril-hydrochlorothiazide) ..... One daily  His updated medication list for this problem includes:    Amlodipine Besylate 10 Mg Tabs (Amlodipine besylate) ..... One daily    Benazepril-hydrochlorothiazide 20-25 Mg Tabs (Benazepril-hydrochlorothiazide) ..... One daily  Problem # 3:  HYPERLIPIDEMIA (ICD-272.4)  His updated medication list for this problem includes:    Crestor 40 Mg Tabs (Rosuvastatin calcium) ..... One daily  His updated medication list for this problem includes:    Crestor 40 Mg Tabs (Rosuvastatin calcium) ..... One daily  Complete Medication List: 1)  Klor-con M20 20 Meq Tbcr (Potassium chloride crys cr) .... 2 once daily 2)  Flonase 50 Mcg/act Susp (Fluticasone propionate) .... Use once daily as needed 3)  Amlodipine Besylate 10 Mg Tabs (Amlodipine besylate) .... One daily 4)  Colchicine 0.6 Mg Tabs (Colchicine) .... One three times daily with meals 5)  Benazepril-hydrochlorothiazide 20-25 Mg Tabs (Benazepril-hydrochlorothiazide) .... One daily 6)  Crestor 40 Mg Tabs (Rosuvastatin calcium) .... One daily 7)  Cialis 5 Mg Tabs (Tadalafil) .... Take as dir 8)  Diclofenac Sodium 75 Mg Tbec (Diclofenac sodium) .... One twice daily as needed for low back pain 9)  Cyclobenzaprine Hcl 10 Mg Tabs (Cyclobenzaprine hcl) .... One at bedtime 10)  Hydrocodone-acetaminophen 5-500 Mg Tabs (Hydrocodone-acetaminophen) .... One every 6 hours as needed for pain 11)  Metformin Hcl 500 Mg Xr24h-tab (Metformin hcl) .... Two daily  Patient Instructions: 1)  Please schedule a follow-up appointment in 3 months for CPX 2)  Limit your Sodium (Salt). 3)  It is important that you exercise  regularly at least 20 minutes 5 times a week. If you develop chest pain, have severe difficulty breathing, or feel very tired , stop exercising immediately and seek medical attention. 4)  You need to lose weight. Consider a lower calorie diet and regular exercise.  5)  Check your blood sugars regularly. If your readings are usually above : or below 70 you should contact our office. 6)  It is important that your Diabetic A1c level is checked every 3 months. 7)  See your eye doctor yearly to check for diabetic eye damage. Prescriptions: METFORMIN HCL 500 MG XR24H-TAB (METFORMIN HCL) two daily  #180 x 6   Entered and Authorized by:   Gordy Savers  MD   Signed by:   Gordy Savers  MD on 02/24/2010   Method used:   Faxed to ...       [facility] (mail-order)             , [place]         Ph:        Fax: [phone]   RxID:   [id number] CIALIS 5 MG TABS (TADALAFIL) Take as dir  #90 x 6   Entered and Authorized by:   Gordy Savers  MD   Signed by:   Gordy Savers  MD on 02/24/2010   Method used:   Faxed to ...       [facility] (mail-order)             , [place]         Ph:        Fax: [phone]   RxID:   [id number] CRESTOR  40 MG TABS (ROSUVASTATIN CALCIUM) one daily  #90 x 6   Entered and Authorized by:   Gordy Savers  MD   Signed by:   Gordy Savers  MD on 02/24/2010   Method used:   Faxed to ...       [facility] (mail-order)             , [place]         Ph:        Fax: [phone]   RxID:   [id number] BENAZEPRIL-HYDROCHLOROTHIAZIDE 20-25 MG TABS (BENAZEPRIL-HYDROCHLOROTHIAZIDE) one daily  #90 x 6   Entered and Authorized by:   Gordy Savers  MD   Signed by:   Gordy Savers  MD on 02/24/2010   Method used:   Faxed to ...       [facility] (mail-order)             , [place]         Ph:        Fax: [phone]   RxID:   [id number] AMLODIPINE BESYLATE 10 MG  TABS (AMLODIPINE BESYLATE) one daily  #90 x 6   Entered and  Authorized by:   Gordy Savers  MD   Signed by:   Gordy Savers  MD on 02/24/2010   Method used:   Faxed to ...       [facility] (mail-order)             , [place]         Ph:        Fax: [phone]   RxID:   [id number] FLONASE 50 MCG/ACT  SUSP (FLUTICASONE PROPIONATE) use once daily as needed  #3 x 6   Entered and Authorized by:   Gordy Savers  MD   Signed by:   Gordy Savers  MD on 02/24/2010   Method used:   Faxed to ...       [facility] (mail-order)             , [place]         Ph:        Fax: [phone]   RxID:   [id number] KLOR-CON M20 20 MEQ  TBCR (POTASSIUM CHLORIDE CRYS CR) 2 once daily  #180 x 6   Entered and Authorized by:   Gordy Savers  MD   Signed by:   Gordy Savers  MD on 02/24/2010   Method used:   Faxed to ...       [facility] (mail-order)             , [place]         Ph:        Fax: [phone]   RxID:   [id number] METFORMIN HCL 500 MG XR24H-TAB (METFORMIN HCL) two daily  #180 x 6   Entered and Authorized by:   Gordy Savers  MD   Signed by:   Gordy Savers  MD on 02/24/2010   Method used:   Electronically to        [facility] [address] (retail)       [phone]       [place], [place]  [address]       Ph: [phone]       Fax: [phone]   RxID:   [id number] CIALIS 5 MG TABS (TADALAFIL) Take as dir  #90 x  6   Entered and Authorized by:   Gordy Savers  MD   Signed by:   Gordy Savers  MD on 02/24/2010   Method used:   Electronically to        [facility] [address] (retail)       [phone]       [place], [place]  [address]       Ph: [phone]       Fax: [phone]   RxID:   [id number] CRESTOR 40 MG TABS (ROSUVASTATIN CALCIUM) one daily  #90 x 6   Entered and Authorized by:   Gordy Savers  MD   Signed by:   Gordy Savers  MD on 02/24/2010   Method used:   Electronically to          [facility] [address] (retail)       [phone]       [place], [place]  [address]       Ph: [phone]       Fax: [phone]   RxID:   [id number] BENAZEPRIL-HYDROCHLOROTHIAZIDE 20-25 MG TABS (BENAZEPRIL-HYDROCHLOROTHIAZIDE) one daily  #90 x 6   Entered and Authorized by:   Gordy Savers  MD   Signed by:   Gordy Savers  MD on 02/24/2010   Method used:   Electronically to        [facility] [address] (retail)       [phone]       [place], [place]  [address]       Ph: [phone]       Fax: [phone]   RxID:   [id number] AMLODIPINE BESYLATE 10 MG  TABS (AMLODIPINE BESYLATE) one daily  #90 x 6   Entered and Authorized by:   Gordy Savers  MD   Signed by:   Gordy Savers  MD on 02/24/2010   Method used:   Electronically to        [facility] [address] (retail)       [phone]       [place], [place]  [address]       Ph: [phone]       Fax: [phone]   RxID:   [id number] FLONASE 50 MCG/ACT  SUSP (FLUTICASONE PROPIONATE) use once daily as needed  #3 x 6   Entered and Authorized by:   Gordy Savers  MD   Signed by:   Gordy Savers  MD on 02/24/2010   Method used:   Electronically to        [facility] [address] (retail)       [phone]       [place], [place]  [address]       Ph: [phone]       Fax: [phone]   RxID:   [id number] KLOR-CON M20 20 MEQ  TBCR (POTASSIUM CHLORIDE CRYS CR) 2 once daily  #180 x 6   Entered and Authorized by:   Gordy Savers  MD   Signed by:   Gordy Savers  MD on 02/24/2010   Method used:   Electronically to        [facility] [address] (retail)  [address]       [place], [place]  [address]       Ph: [phone]       Fax: [phone]   RxID:    [id number]    Orders Added: 1)  Capillary Blood Glucose/CBG [82948] 2)  Est. Patient Level III [53664] 3)  Venipuncture [36415] 4)  TLB-A1C / Hgb A1C (Glycohemoglobin) [83036-A1C] 5)  Specimen Handling [99000]

## 2010-06-16 LAB — BASIC METABOLIC PANEL
CO2: 33 mEq/L — ABNORMAL HIGH (ref 19–32)
Chloride: 102 mEq/L (ref 96–112)
GFR calc non Af Amer: >60 mL/min (ref >60)
Glucose, Bld: 113 mg/dL — ABNORMAL HIGH (ref 70–99)

## 2010-06-16 LAB — GLUCOSE, CAPILLARY
Glucose-Capillary: 114 mg/dL — ABNORMAL HIGH (ref 70–99)
Glucose-Capillary: 140 mg/dL — ABNORMAL HIGH (ref 70–99)
Glucose-Capillary: 152 mg/dL — ABNORMAL HIGH (ref 70–99)
Glucose-Capillary: 175 mg/dL — ABNORMAL HIGH (ref 70–99)
Glucose-Capillary: 177 mg/dL — ABNORMAL HIGH (ref 70–99)

## 2010-06-16 LAB — CBC
HCT: 45.5 % (ref 39.0–52.0)
RBC: 5.39 MIL/uL (ref 4.22–5.81)
RDW: 15.6 % — ABNORMAL HIGH (ref 11.5–15.5)

## 2010-06-16 LAB — HEMOGLOBIN AND HEMATOCRIT, BLOOD: HCT: 41.3 % (ref 39.0–52.0)

## 2010-06-16 LAB — TYPE AND SCREEN
ABO/RH(D): B POS
Antibody Screen: NEGATIVE

## 2010-07-26 NOTE — Assessment & Plan Note (Signed)
[facility]                            [facility] OFFICE NOTE   NAME:[name] [name] SWOYER                      MRN:          [id number]  DATE:08/16/2006                            DOB:          [birth year]    A 55 year old gentleman seen today for an annual exam.  He has  hypertension, hyperlipidemia, seasonal allergic rhinitis.  He has done  quite well.  He has had no prior hospital admissions.  He has no  concerns or complaints today.   REVIEW OF SYSTEMS:  Negative.  No prior screening colonoscopies.   FAMILY HISTORY:  Father is 20 with history of diabetes and hypertension.  Has recently been diagnosed with polyps.  Mother age 36, is well.  Five  brothers, 1 sister positive for hypertension.   EXAMINATION:  Revealed a well-developed, muscular, black male, in no  acute distress.  Blood pressure 122/86.  Fundi, ears, nose, and throat clear.  NECK:  Revealed no adenopathy.  There was a suggestion of some fullness  in the right anterior neck, but no definite thyromegaly was appreciated.  CHEST:  Clear.  CARDIOVASCULAR:  Normal S1 and S2.  No murmur.  ABDOMEN:  Obese, soft, and non-tender.  No organomegaly.  No bruits.  EXTERNAL GENITALIA:  Normal.  RECTAL EXAM:  Prostate +1.  Hemoccult negative.  EXTREMITIES:  Full peripheral pulses.  No edema.   IMPRESSION:  1. Hypertension.  2. Hyperlipidemia.  3. History of seasonal allergic rhinitis.   DISPOSITION:  We will set up for a screening colonoscopy at his  convenience.  Weight loss encouraged.  We will reassess in 6 months.     Gordy Savers, MD  Electronically Signed    PFK/MedQ  DD: 08/16/2006  DT: 08/16/2006  Job #: [id number]

## 2010-08-08 ENCOUNTER — Other Ambulatory Visit: Payer: Self-pay | Admitting: Internal Medicine

## 2011-01-10 ENCOUNTER — Ambulatory Visit (INDEPENDENT_AMBULATORY_CARE_PROVIDER_SITE_OTHER): Payer: Self-pay | Admitting: General Surgery

## 2011-01-25 ENCOUNTER — Encounter (INDEPENDENT_AMBULATORY_CARE_PROVIDER_SITE_OTHER): Payer: Self-pay | Admitting: General Surgery

## 2011-01-25 ENCOUNTER — Ambulatory Visit (INDEPENDENT_AMBULATORY_CARE_PROVIDER_SITE_OTHER): Payer: BC Managed Care – PPO | Admitting: General Surgery

## 2011-01-25 VITALS — BP 136/92 | HR 68 | Temp 98.9°F | Resp 16 | Ht 67.0 in | Wt 248.1 lb

## 2011-01-25 DIAGNOSIS — K439 Ventral hernia without obstruction or gangrene: Secondary | ICD-10-CM

## 2011-01-25 NOTE — Progress Notes (Signed)
Subjective:     Patient ID: Kenneth [name], male   DOB: 12/27/1955, 55 y.o.   MRN: [id number]  HPI We are asked to see the patient in consultation by Dr. Les Borden to evaluate him for a ventral hernia. The patient is a 55-year-old black male who is about 2 years out from a robotic prostatectomy for prostate cancer. Sometime after surgery he started to notice a bulge just above his umbilicus. He has a little discomfort associated with it. He denies any nausea or vomiting. He denies any chest pain or shortness of breath.  Review of Systems  Constitutional: Negative.   HENT: Negative.   Eyes: Negative.   Respiratory: Negative.   Cardiovascular: Negative.   Gastrointestinal: Negative.   Genitourinary: Negative.   Musculoskeletal: Negative.   Skin: Negative.   Neurological: Negative.   Hematological: Negative.   Psychiatric/Behavioral: Negative.    Past Medical History  Diagnosis Date  . Diabetes mellitus   . Hypertension   . Cancer     prostate   Past Surgical History  Procedure Date  . Prostatectomy october 15th, 2010   Current Outpatient Prescriptions  Medication Sig Dispense Refill  . amLODipine (NORVASC) 10 MG tablet Take 10 mg by mouth.        . benazepril-hydrochlorthiazide (LOTENSIN HCT) 20-25 MG per tablet Take 1 tablet by mouth daily.        . metFORMIN (GLUCOPHAGE) 500 MG tablet Take 500 mg by mouth 2 (two) times daily with a meal.        . potassium chloride SA (K-DUR,KLOR-CON) 20 MEQ tablet TAKE 2 TABLETS ONCE DAILY  180 tablet  2  . rosuvastatin (CRESTOR) 20 MG tablet Take 20 mg by mouth daily.        . tadalafil (CIALIS) 5 MG tablet Take 5 mg by mouth 2 (two) times daily.         No Known Allergies     Objective:   Physical Exam  Constitutional: He is oriented to person, place, and time. He appears well-developed and well-nourished.  HENT:  Head: Normocephalic and atraumatic.  Eyes: Conjunctivae and EOM are normal. Pupils are equal, round, and reactive to  light.  Neck: Normal range of motion. Neck supple.  Cardiovascular: Normal rate, regular rhythm and normal heart sounds.   Pulmonary/Chest: Effort normal and breath sounds normal.  Abdominal: Soft. Bowel sounds are normal.       He has a reducible bulge just above the umbilicus at a previous port site.  Musculoskeletal: Normal range of motion.  Neurological: He is alert and oriented to person, place, and time.  Skin: Skin is warm and dry.  Psychiatric: He has a normal mood and affect. His behavior is normal.       Assessment:     Reducible ventral hernia at previous port site.    Plan:     Because of the risk of incarceration and strangulation I think he would benefit from having this fixed. He would also like to have this done. I have discussed with him in detail the risks and benefits of the operation a fixed hernia as well as some of the technical aspects and he understands and wishes to proceed. I think he would be a good laparoscopic candidate. He agrees.      

## 2011-02-08 ENCOUNTER — Encounter (HOSPITAL_COMMUNITY): Payer: Self-pay | Admitting: Pharmacy Technician

## 2011-02-20 ENCOUNTER — Other Ambulatory Visit: Payer: Self-pay

## 2011-02-20 ENCOUNTER — Encounter (HOSPITAL_COMMUNITY)
Admission: RE | Admit: 2011-02-20 | Discharge: 2011-02-20 | Disposition: A | Discharge status: Home / Self Care | Payer: BC Managed Care – PPO | Source: Ambulatory Visit | Attending: General Surgery | Admitting: General Surgery

## 2011-02-20 ENCOUNTER — Encounter (HOSPITAL_COMMUNITY): Payer: Self-pay

## 2011-02-20 ENCOUNTER — Other Ambulatory Visit: Payer: Self-pay | Admitting: Internal Medicine

## 2011-02-20 HISTORY — DX: Unspecified osteoarthritis, unspecified site: M19.90

## 2011-02-20 LAB — DIFFERENTIAL
Eosinophils Absolute: 0.2 10*3/uL (ref 0.0–0.7)
Lymphocytes Relative: 49 % — ABNORMAL HIGH (ref 12–46)
Lymphs Abs: 2 10*3/uL (ref 0.7–4.0)
Monocytes Relative: 13 % — ABNORMAL HIGH (ref 3–12)
Neutrophils Relative %: 32 % — ABNORMAL LOW (ref 43–77)

## 2011-02-20 LAB — CBC
Hemoglobin: 13.5 g/dL (ref 13.0–17.0)
MCH: 26.6 pg (ref 26.0–34.0)
RBC: 5.07 MIL/uL (ref 4.22–5.81)
WBC: 4 10*3/uL (ref 4.0–10.5)

## 2011-02-20 LAB — BASIC METABOLIC PANEL
CO2: 29 mEq/L (ref 19–32)
GFR calc non Af Amer: 82 mL/min — ABNORMAL LOW (ref >90)
Glucose, Bld: 122 mg/dL — ABNORMAL HIGH (ref 70–99)
Potassium: 4.1 mEq/L (ref 3.5–5.1)
Sodium: 139 mEq/L (ref 135–145)

## 2011-02-20 LAB — SURGICAL PCR SCREEN: Staphylococcus aureus: POSITIVE — AB

## 2011-02-20 MED ORDER — METFORMIN HCL 500 MG PO TABS: 1000.0000 mg | ORAL_TABLET | Freq: Every day | ORAL | Status: DC

## 2011-02-20 NOTE — Telephone Encounter (Signed)
Pt req a 30 day supply of metFORMIN (GLUCOPHAGE) 500 MG tablet to [facility] on [address]. Pt only has 1 pill left. Pt needs this called in today.

## 2011-02-20 NOTE — Pre-Procedure Instructions (Signed)
Kenneth [name]  02/20/2011   Your procedure is scheduled on:  02/22/11  Report to [facility] at 6:30 AM.  Call this number if you have problems the morning of surgery: [phone]   Remember:   Do not eat food:After Midnight.  May have clear liquids: up to 4 Hours before arrival.  Clear liquids include soda, tea, black coffee, apple or grape juice, broth.  Take these medicines the morning of surgery with A SIP OF WATER: norvasc   Do not wear jewelry, make-up or nail polish.  Do not wear lotions, powders, or perfumes. You may wear deodorant.  Do not shave 48 hours prior to surgery.  Do not bring valuables to the hospital.  Contacts, dentures or bridgework may not be worn into surgery.  Leave suitcase in the car. After surgery it may be brought to your room.  For patients admitted to the hospital, checkout time is 11:00 AM the day of discharge.   Patients discharged the day of surgery will not be allowed to drive home.  Name and phone number of your driver: wife- [name]   Special Instructions: CHG Shower Use Special Wash: 1/2 bottle night before surgery and 1/2 bottle morning of surgery.   Please read over the following fact sheets that you were given: Pain Booklet, Coughing and Deep Breathing, Total Joint Packet and MRSA Information

## 2011-02-21 MED ORDER — CEFAZOLIN SODIUM-DEXTROSE 2-3 GM-% IV SOLR
2.0000 g | INTRAVENOUS | Status: AC
  Administered 2011-02-22: 2 g via INTRAVENOUS
  Filled 2011-02-21: qty 50

## 2011-02-22 ENCOUNTER — Encounter (HOSPITAL_COMMUNITY): Payer: Self-pay | Admitting: Anesthesiology

## 2011-02-22 ENCOUNTER — Ambulatory Visit (HOSPITAL_COMMUNITY)
Admission: RE | Admit: 2011-02-22 | Discharge: 2011-02-23 | Disposition: A | Discharge status: Home / Self Care | Payer: BC Managed Care – PPO | Source: Ambulatory Visit | Attending: General Surgery | Admitting: General Surgery

## 2011-02-22 ENCOUNTER — Ambulatory Visit (HOSPITAL_COMMUNITY): Payer: BC Managed Care – PPO | Admitting: Anesthesiology

## 2011-02-22 ENCOUNTER — Encounter (HOSPITAL_COMMUNITY)
Admission: RE | Disposition: A | Discharge status: Home / Self Care | Payer: Self-pay | Source: Ambulatory Visit | Attending: General Surgery

## 2011-02-22 DIAGNOSIS — K439 Ventral hernia without obstruction or gangrene: Secondary | ICD-10-CM

## 2011-02-22 DIAGNOSIS — K272 Acute peptic ulcer, site unspecified, with both hemorrhage and perforation: Secondary | ICD-10-CM | POA: Insufficient documentation

## 2011-02-22 DIAGNOSIS — Z01810 Encounter for preprocedural cardiovascular examination: Secondary | ICD-10-CM | POA: Insufficient documentation

## 2011-02-22 DIAGNOSIS — Z01818 Encounter for other preprocedural examination: Secondary | ICD-10-CM | POA: Insufficient documentation

## 2011-02-22 DIAGNOSIS — Z01812 Encounter for preprocedural laboratory examination: Secondary | ICD-10-CM | POA: Insufficient documentation

## 2011-02-22 DIAGNOSIS — E119 Type 2 diabetes mellitus without complications: Secondary | ICD-10-CM | POA: Insufficient documentation

## 2011-02-22 DIAGNOSIS — I10 Essential (primary) hypertension: Secondary | ICD-10-CM | POA: Insufficient documentation

## 2011-02-22 HISTORY — DX: Pure hypercholesterolemia, unspecified: E78.00

## 2011-02-22 HISTORY — PX: ABDOMINAL HERNIA REPAIR: SHX539

## 2011-02-22 HISTORY — PX: HERNIA REPAIR: SHX51

## 2011-02-22 HISTORY — PX: VENTRAL HERNIA REPAIR: SHX424

## 2011-02-22 LAB — GLUCOSE, CAPILLARY
Glucose-Capillary: 120 mg/dL — ABNORMAL HIGH (ref 70–99)
Glucose-Capillary: 173 mg/dL — ABNORMAL HIGH (ref 70–99)

## 2011-02-22 SURGERY — REPAIR, HERNIA, VENTRAL, LAPAROSCOPIC
Anesthesia: General | Site: Abdomen | Wound class: Clean

## 2011-02-22 MED ORDER — DIPHENHYDRAMINE HCL 12.5 MG/5ML PO ELIX
12.5000 mg | ORAL_SOLUTION | Freq: Four times a day (QID) | ORAL | Status: DC | PRN
  Filled 2011-02-22: qty 5

## 2011-02-22 MED ORDER — POTASSIUM CHLORIDE CRYS ER 10 MEQ PO TBCR
10.0000 meq | EXTENDED_RELEASE_TABLET | Freq: Every day | ORAL | Status: DC
  Administered 2011-02-22: 10 meq via ORAL
  Filled 2011-02-22 (×2): qty 1

## 2011-02-22 MED ORDER — BUPIVACAINE-EPINEPHRINE 0.25% -1:200000 IJ SOLN
INTRAMUSCULAR | Status: DC | PRN
  Administered 2011-02-22: 15 mL

## 2011-02-22 MED ORDER — DIPHENHYDRAMINE HCL 50 MG/ML IJ SOLN: 12.5000 mg | Freq: Four times a day (QID) | INTRAMUSCULAR | Status: DC | PRN

## 2011-02-22 MED ORDER — MORPHINE SULFATE (PF) 1 MG/ML IV SOLN
INTRAVENOUS | Status: DC
  Administered 2011-02-22: 17:00:00 via INTRAVENOUS
  Administered 2011-02-22: 25 mg via INTRAVENOUS
  Administered 2011-02-22: 12:00:00 via INTRAVENOUS
  Filled 2011-02-22 (×2): qty 25

## 2011-02-22 MED ORDER — POTASSIUM CHLORIDE IN NACL 20-0.9 MEQ/L-% IV SOLN
Freq: Once | INTRAVENOUS | Status: DC
  Filled 2011-02-22 (×2): qty 1000

## 2011-02-22 MED ORDER — DROPERIDOL 2.5 MG/ML IJ SOLN
INTRAMUSCULAR | Status: DC | PRN
  Administered 2011-02-22: .625 mg via INTRAVENOUS

## 2011-02-22 MED ORDER — MEPERIDINE HCL 25 MG/ML IJ SOLN: 6.2500 mg | INTRAMUSCULAR | Status: DC | PRN

## 2011-02-22 MED ORDER — HYDROCODONE-ACETAMINOPHEN 5-325 MG PO TABS
1.0000 | ORAL_TABLET | ORAL | Status: DC | PRN
  Administered 2011-02-23: 2 via ORAL
  Filled 2011-02-22: qty 2
  Filled 2011-02-22: qty 1

## 2011-02-22 MED ORDER — ONDANSETRON HCL 4 MG/2ML IJ SOLN: 4.0000 mg | Freq: Four times a day (QID) | INTRAMUSCULAR | Status: DC | PRN

## 2011-02-22 MED ORDER — PROMETHAZINE HCL 25 MG/ML IJ SOLN: 6.2500 mg | INTRAMUSCULAR | Status: DC | PRN

## 2011-02-22 MED ORDER — SODIUM CHLORIDE 0.9 % IR SOLN
Status: DC | PRN
  Administered 2011-02-22 (×2): 1000 mL

## 2011-02-22 MED ORDER — NALOXONE HCL 0.4 MG/ML IJ SOLN: 0.4000 mg | INTRAMUSCULAR | Status: DC | PRN

## 2011-02-22 MED ORDER — GLYCOPYRROLATE 0.2 MG/ML IJ SOLN
INTRAMUSCULAR | Status: DC | PRN
  Administered 2011-02-22: .6 mg via INTRAVENOUS

## 2011-02-22 MED ORDER — HYDROMORPHONE HCL PF 1 MG/ML IJ SOLN: 0.2500 mg | INTRAMUSCULAR | Status: DC | PRN

## 2011-02-22 MED ORDER — HYDROCHLOROTHIAZIDE 25 MG PO TABS
25.0000 mg | ORAL_TABLET | Freq: Every day | ORAL | Status: DC
  Administered 2011-02-22: 25 mg via ORAL
  Filled 2011-02-22 (×2): qty 1

## 2011-02-22 MED ORDER — BENAZEPRIL HCL 20 MG PO TABS
20.0000 mg | ORAL_TABLET | Freq: Every day | ORAL | Status: DC
  Administered 2011-02-22: 20 mg via ORAL
  Filled 2011-02-22 (×2): qty 1

## 2011-02-22 MED ORDER — ONDANSETRON HCL 4 MG/2ML IJ SOLN
4.0000 mg | Freq: Four times a day (QID) | INTRAMUSCULAR | Status: DC | PRN
  Administered 2011-02-22: 4 mg via INTRAVENOUS
  Filled 2011-02-22: qty 2

## 2011-02-22 MED ORDER — MORPHINE SULFATE 2 MG/ML IJ SOLN: 4.0000 mg | INTRAMUSCULAR | Status: DC | PRN

## 2011-02-22 MED ORDER — MIDAZOLAM HCL 5 MG/5ML IJ SOLN
INTRAMUSCULAR | Status: DC | PRN
  Administered 2011-02-22: 2 mg via INTRAVENOUS

## 2011-02-22 MED ORDER — SUFENTANIL CITRATE 50 MCG/ML IV SOLN
INTRAVENOUS | Status: DC | PRN
  Administered 2011-02-22 (×3): 10 ug via INTRAVENOUS

## 2011-02-22 MED ORDER — METFORMIN HCL 500 MG PO TABS
1000.0000 mg | ORAL_TABLET | Freq: Every day | ORAL | Status: DC
  Administered 2011-02-23: 1000 mg via ORAL
  Filled 2011-02-22 (×2): qty 2

## 2011-02-22 MED ORDER — POTASSIUM CHLORIDE IN NACL 20-0.9 MEQ/L-% IV SOLN
INTRAVENOUS | Status: DC
  Filled 2011-02-22 (×3): qty 1000

## 2011-02-22 MED ORDER — PROPOFOL 10 MG/ML IV BOLUS
INTRAVENOUS | Status: DC | PRN
  Administered 2011-02-22: 40 mg via INTRAVENOUS
  Administered 2011-02-22: 160 mg via INTRAVENOUS
  Administered 2011-02-22: 40 mg via INTRAVENOUS

## 2011-02-22 MED ORDER — NEOSTIGMINE METHYLSULFATE 1 MG/ML IJ SOLN
INTRAMUSCULAR | Status: DC | PRN
  Administered 2011-02-22: 5 mg via INTRAVENOUS

## 2011-02-22 MED ORDER — ROCURONIUM BROMIDE 100 MG/10ML IV SOLN
INTRAVENOUS | Status: DC | PRN
  Administered 2011-02-22: 10 mg via INTRAVENOUS
  Administered 2011-02-22: 50 mg via INTRAVENOUS
  Administered 2011-02-22: 10 mg via INTRAVENOUS

## 2011-02-22 MED ORDER — SODIUM CHLORIDE 0.9 % IJ SOLN: 9.0000 mL | INTRAMUSCULAR | Status: DC | PRN

## 2011-02-22 MED ORDER — ONDANSETRON HCL 4 MG/2ML IJ SOLN
INTRAMUSCULAR | Status: DC | PRN
  Administered 2011-02-22: 4 mg via INTRAVENOUS

## 2011-02-22 MED ORDER — AMLODIPINE BESYLATE 10 MG PO TABS
10.0000 mg | ORAL_TABLET | Freq: Every day | ORAL | Status: DC
  Administered 2011-02-22: 10 mg via ORAL
  Filled 2011-02-22 (×2): qty 1

## 2011-02-22 MED ORDER — BENAZEPRIL-HYDROCHLOROTHIAZIDE 20-25 MG PO TABS: 1.0000 | ORAL_TABLET | Freq: Every day | ORAL | Status: DC

## 2011-02-22 MED ORDER — INSULIN ASPART 100 UNIT/ML ~~LOC~~ SOLN
0.0000 [IU] | Freq: Three times a day (TID) | SUBCUTANEOUS | Status: DC
  Administered 2011-02-22: 3 [IU] via SUBCUTANEOUS
  Filled 2011-02-22: qty 3

## 2011-02-22 MED ORDER — INFLUENZA VIRUS VACC SPLIT PF IM SUSP
0.5000 mL | INTRAMUSCULAR | Status: AC
  Administered 2011-02-23: 0.5 mL via INTRAMUSCULAR
  Filled 2011-02-22 (×2): qty 0.5

## 2011-02-22 MED ORDER — LACTATED RINGERS IV SOLN
INTRAVENOUS | Status: DC | PRN
  Administered 2011-02-22 (×2): via INTRAVENOUS

## 2011-02-22 MED ORDER — ONDANSETRON HCL 4 MG PO TABS: 4.0000 mg | ORAL_TABLET | Freq: Four times a day (QID) | ORAL | Status: DC | PRN

## 2011-02-22 MED ORDER — ROSUVASTATIN CALCIUM 20 MG PO TABS
20.0000 mg | ORAL_TABLET | Freq: Every day | ORAL | Status: DC
  Administered 2011-02-22: 20 mg via ORAL
  Filled 2011-02-22 (×2): qty 1

## 2011-02-22 SURGICAL SUPPLY — 58 items
ADH SKN CLS APL DERMABOND .7 (GAUZE/BANDAGES/DRESSINGS) ×2
APL SKNCLS STERI-STRIP NONHPOA (GAUZE/BANDAGES/DRESSINGS)
APPLIER CLIP LOGIC TI 5 (MISCELLANEOUS) IMPLANT
APPLIER CLIP ROT 10 11.4 M/L (STAPLE)
APR CLP MED LRG 11.4X10 (STAPLE)
APR CLP MED LRG 33X5 (MISCELLANEOUS)
BANDAGE GAUZE ELAST BULKY 4 IN (GAUZE/BANDAGES/DRESSINGS) ×2 IMPLANT
BENZOIN TINCTURE PRP APPL 2/3 (GAUZE/BANDAGES/DRESSINGS) IMPLANT
CANISTER SUCTION 2500CC (MISCELLANEOUS) IMPLANT
CHLORAPREP W/TINT 26ML (MISCELLANEOUS) ×3 IMPLANT
CLIP APPLIE ROT 10 11.4 M/L (STAPLE) IMPLANT
CLOTH BEACON ORANGE TIMEOUT ST (SAFETY) ×2 IMPLANT
COVER SURGICAL LIGHT HANDLE (MISCELLANEOUS) ×2 IMPLANT
DECANTER SPIKE VIAL GLASS SM (MISCELLANEOUS) ×1 IMPLANT
DERMABOND ADVANCED (GAUZE/BANDAGES/DRESSINGS) ×2
DERMABOND ADVANCED .7 DNX12 (GAUZE/BANDAGES/DRESSINGS) ×1 IMPLANT
DEVICE SECURE STRAP 25 ABSORB (INSTRUMENTS) ×3 IMPLANT
DEVICE TROCAR PUNCTURE CLOSURE (ENDOMECHANICALS) ×2 IMPLANT
DRAPE INCISE IOBAN 66X45 STRL (DRAPES) ×2 IMPLANT
DRAPE UTILITY 15X26 W/TAPE STR (DRAPE) ×4 IMPLANT
ELECT CAUTERY BLADE 6.4 (BLADE) ×1 IMPLANT
ELECT REM PT RETURN 9FT ADLT (ELECTROSURGICAL) ×2
ELECTRODE REM PT RTRN 9FT ADLT (ELECTROSURGICAL) ×1 IMPLANT
GLOVE BIO SURGEON STRL SZ 6.5 (GLOVE) ×1 IMPLANT
GLOVE BIO SURGEON STRL SZ7.5 (GLOVE) ×4 IMPLANT
GLOVE BIOGEL PI IND STRL 7.0 (GLOVE) IMPLANT
GLOVE BIOGEL PI IND STRL 7.5 (GLOVE) IMPLANT
GLOVE BIOGEL PI INDICATOR 7.0 (GLOVE) ×3
GLOVE BIOGEL PI INDICATOR 7.5 (GLOVE) ×1
GLOVE SURG SS PI 6.5 STRL IVOR (GLOVE) ×1 IMPLANT
GOWN PREVENTION PLUS XLARGE (GOWN DISPOSABLE) ×1 IMPLANT
GOWN STRL NON-REIN LRG LVL3 (GOWN DISPOSABLE) ×5 IMPLANT
KIT BASIN OR (CUSTOM PROCEDURE TRAY) ×2 IMPLANT
KIT ROOM TURNOVER OR (KITS) ×2 IMPLANT
MESH PHYSIO OVAL 10X15CM (Mesh General) ×2 IMPLANT
NDL SPNL 22GX3.5 QUINCKE BK (NEEDLE) ×1 IMPLANT
NEEDLE SPNL 22GX3.5 QUINCKE BK (NEEDLE) ×2 IMPLANT
NS IRRIG 1000ML POUR BTL (IV SOLUTION) ×2 IMPLANT
PAD ARMBOARD 7.5X6 YLW CONV (MISCELLANEOUS) ×4 IMPLANT
PEN SKIN MARKING BROAD (MISCELLANEOUS) ×2 IMPLANT
PENCIL BUTTON HOLSTER BLD 10FT (ELECTRODE) ×1 IMPLANT
SCALPEL HARMONIC ACE (MISCELLANEOUS) IMPLANT
SCISSORS LAP 5X35 DISP (ENDOMECHANICALS) IMPLANT
SET IRRIG TUBING LAPAROSCOPIC (IRRIGATION / IRRIGATOR) ×1 IMPLANT
SLEEVE ENDOPATH XCEL 5M (ENDOMECHANICALS) ×1 IMPLANT
SUT MNCRL AB 4-0 PS2 18 (SUTURE) ×4 IMPLANT
SUT NOVA NAB DX-16 0-1 5-0 T12 (SUTURE) ×4 IMPLANT
SUT VIC AB 2-0 SH 27 (SUTURE) ×2
SUT VIC AB 2-0 SH 27X BRD (SUTURE) IMPLANT
SUT VICRYL 0 UR6 27IN ABS (SUTURE) ×1 IMPLANT
TOWEL OR 17X24 6PK STRL BLUE (TOWEL DISPOSABLE) ×2 IMPLANT
TOWEL OR 17X26 10 PK STRL BLUE (TOWEL DISPOSABLE) ×2 IMPLANT
TRAY FOLEY CATH 14FRSI W/METER (CATHETERS) ×2 IMPLANT
TRAY LAPAROSCOPIC (CUSTOM PROCEDURE TRAY) ×2 IMPLANT
TROCAR XCEL BLUNT TIP 100MML (ENDOMECHANICALS) ×2 IMPLANT
TROCAR XCEL NON-BLD 11X100MML (ENDOMECHANICALS) ×2 IMPLANT
TROCAR XCEL NON-BLD 5MMX100MML (ENDOMECHANICALS) ×2 IMPLANT
WATER STERILE IRR 1000ML POUR (IV SOLUTION) IMPLANT

## 2011-02-22 NOTE — Progress Notes (Signed)
All checlist data including NPO status reviewed with patient in holding area.

## 2011-02-22 NOTE — Progress Notes (Signed)
Report given to jamie rn as caregiver 

## 2011-02-22 NOTE — Interval H&P Note (Signed)
History and Physical Interval Note:  02/22/2011 8:10 AM  Kenneth [name]  has presented today for surgery, with the diagnosis of Hernia, ventral [553.20]  The various methods of treatment have been discussed with the patient and family. After consideration of risks, benefits and other options for treatment, the patient has consented to  Procedure(s): LAPAROSCOPIC VENTRAL HERNIA as a surgical intervention .  The patients' history has been reviewed, patient examined, no change in status, stable for surgery.  I have reviewed the patients' chart and labs.  Questions were answered to the patient's satisfaction.     TOTH III,PAUL S

## 2011-02-22 NOTE — Anesthesia Postprocedure Evaluation (Signed)
  Anesthesia Post-op Note  Patient: Kenneth [name]  Procedure(s) Performed:  LAPAROSCOPIC VENTRAL HERNIA - laparoscopic ventral hernia repair with mesh  Patient Location: PACU  Anesthesia Type: General  Level of Consciousness: sedated  Airway and Oxygen Therapy: Patient Spontanous Breathing and Patient connected to face mask oxygen  Post-op Pain: mild  Post-op Assessment: Post-op Vital signs reviewed, Patient's Cardiovascular Status Stable, Respiratory Function Stable and Patent Airway  Post-op Vital Signs: Reviewed and stable  Complications: No apparent anesthesia complications

## 2011-02-22 NOTE — Op Note (Signed)
02/22/2011  11:21 AM  PATIENT:  Kenneth [name]  55 y.o. male  PRE-OPERATIVE DIAGNOSIS:  Hernia, ventral [553.20]  POST-OPERATIVE DIAGNOSIS:  Hernia, Ventral  PROCEDURE:  Procedure(s): LAPAROSCOPIC VENTRAL HERNIA  SURGEON:  Surgeon(s): Robyne Askew, MD Currie Paris, MD  PHYSICIAN ASSISTANT:   ASSISTANTS: Dr. Jamey Ripa   ANESTHESIA:   general  EBL:  Total I/O In: 1000 [I.V.:1000] Out: 350 [Urine:325; Blood:25]  BLOOD ADMINISTERED:none  DRAINS: none   LOCAL MEDICATIONS USED:  MARCAINE 10CC  SPECIMEN:  No Specimen  DISPOSITION OF SPECIMEN:  N/A  COUNTS:  YES  TOURNIQUET:  * No tourniquets in log *  DICTATION: .Dragon Dictation after informed consent was obtained the patient was brought to the operating room and placed in the supine position on the operating room table. After adequate induction of general anesthesia the patient's abdomen was prepped with ChloraPrep, allowed to dry, and draped in usual sterile manner including an [place]. The right lateral abdomen was then infiltrated with quarter percent Marcaine. A small stab incision was made with a 15 blade knife. Using a 5 mm trocar and an Optiview we're able to bluntly dissect through all layers of the abdominal wall under direct vision until we entered the abdominal cavity. The abdomen was insufflated with carbon dioxide without difficulty. The laparoscope was then placed through the the 5 mm port and we were able to examine the abdomen. There appeared to be a small ventral hernia centered around the umbilicus and supraumbilical area. There was some incarcerated omentum in this hernia. This was reduced by a combination of sharp dissection with the laparoscopic scissor and some blunt dissection with a blunt grasper. We were then able to reduce the omentum without difficulty. The area appeared to be hemostatic. We then estimated the size of the mass that we wanted the used to cover the defect at about 10 x 10 cm. We chose  a 10 x 15 piece of physio-mesh and cut it to fit. We placed 4 #1 Novafil stitches at equidistant points around the edge of the mesh. We then made a small incision along the midline in order to close the fascial defect. We placed the mesh through this opening. And then closed the fascia with interrupted #1 Novafil stitches. Small stab incisions were then made corresponding to the placement of the stitches. A suture passer was used to bring the tails of the stitches through the abdominal wall. We then pulled up on all the stitches and the mesh was nicely anchored up against the abdominal wall. We tied each of these stitches and divided the tails. The mesh was in good apposition to the abdominal wall. As we placed a secure strap tacks around the periphery of the mesh we noticed that the inferioredge of the mesh was right on the edge  of the hernia defect.We then exchanged a left-sided 5 mm port for a 10 mm port. We placed another piece of 10 x 10 physio-mesh through this port. Before placing the physio-mash into the abdominal cavity we placed 4 equal distant #1 Novafil stitches again around the periphery of the mesh. Each of these stitches were brought through the abdominal wall as before. The mesh now completely cover the fascial defect. Secure strep tacks were placed around the periphery of the mesh in 2 layers so there were no gaps. 2 bleeding points where tacks were placed were controlled by passing a 0 Vicryl tie around the bleeding point with a suture passer and then tying  the knot down tight. This controlled the bleeding. Once this was accomplished the mesh was examined and again was in good apposition to the abdominal wall without any gaps. The hernia was well repaired. The abdomen was hemostatic.we then allowed the gas to escape. The patient tolerated the procedure well. At the end of the case all needle sponge and instrument counts were correct. The patient was then awakened and taken to recovery in stable  condition.  PLAN OF CARE: Admit to inpatient   PATIENT DISPOSITION:  PACU - hemodynamically stable.   Delay start of Pharmacological VTE agent (>24hrs) due to surgical blood loss or risk of bleeding:  {YES/NO/NOT APPLICABLE:20182

## 2011-02-22 NOTE — H&P (View-Only) (Signed)
Subjective:     Patient ID: Kenneth [name], male   DOB: 1955-06-16, 55 y.o.   MRN: [id number]  HPI We are asked to see the patient in consultation by Dr. Crecencio Mc to evaluate him for a ventral hernia. The patient is a 55 year old black male who is about 2 years out from a robotic prostatectomy for prostate cancer. Sometime after surgery he started to notice a bulge just above his umbilicus. He has a little discomfort associated with it. He denies any nausea or vomiting. He denies any chest pain or shortness of breath.  Review of Systems  Constitutional: Negative.   HENT: Negative.   Eyes: Negative.   Respiratory: Negative.   Cardiovascular: Negative.   Gastrointestinal: Negative.   Genitourinary: Negative.   Musculoskeletal: Negative.   Skin: Negative.   Neurological: Negative.   Hematological: Negative.   Psychiatric/Behavioral: Negative.    Past Medical History  Diagnosis Date  . Diabetes mellitus   . Hypertension   . Cancer     prostate   Past Surgical History  Procedure Date  . Prostatectomy october 15th, 2010   Current Outpatient Prescriptions  Medication Sig Dispense Refill  . amLODipine (NORVASC) 10 MG tablet Take 10 mg by mouth.        . benazepril-hydrochlorthiazide (LOTENSIN HCT) 20-25 MG per tablet Take 1 tablet by mouth daily.        . metFORMIN (GLUCOPHAGE) 500 MG tablet Take 500 mg by mouth 2 (two) times daily with a meal.        . potassium chloride SA (K-DUR,KLOR-CON) 20 MEQ tablet TAKE 2 TABLETS ONCE DAILY  180 tablet  2  . rosuvastatin (CRESTOR) 20 MG tablet Take 20 mg by mouth daily.        . tadalafil (CIALIS) 5 MG tablet Take 5 mg by mouth 2 (two) times daily.         No Known Allergies     Objective:   Physical Exam  Constitutional: He is oriented to person, place, and time. He appears well-developed and well-nourished.  HENT:  Head: Normocephalic and atraumatic.  Eyes: Conjunctivae and EOM are normal. Pupils are equal, round, and reactive to  light.  Neck: Normal range of motion. Neck supple.  Cardiovascular: Normal rate, regular rhythm and normal heart sounds.   Pulmonary/Chest: Effort normal and breath sounds normal.  Abdominal: Soft. Bowel sounds are normal.       He has a reducible bulge just above the umbilicus at a previous port site.  Musculoskeletal: Normal range of motion.  Neurological: He is alert and oriented to person, place, and time.  Skin: Skin is warm and dry.  Psychiatric: He has a normal mood and affect. His behavior is normal.       Assessment:     Reducible ventral hernia at previous port site.    Plan:     Because of the risk of incarceration and strangulation I think he would benefit from having this fixed. He would also like to have this done. I have discussed with him in detail the risks and benefits of the operation a fixed hernia as well as some of the technical aspects and he understands and wishes to proceed. I think he would be a good laparoscopic candidate. He agrees.

## 2011-02-22 NOTE — Preoperative (Signed)
Beta Blockers   Reason not to administer Beta Blockers:Not Applicable 

## 2011-02-22 NOTE — Transfer of Care (Signed)
Immediate Anesthesia Transfer of Care Note  Patient: Kenneth [name]  Procedure(s) Performed:  LAPAROSCOPIC VENTRAL HERNIA - laparoscopic ventral hernia repair with mesh  Patient Location: PACU  Anesthesia Type: General  Level of Consciousness: awake and alert   Airway & Oxygen Therapy: Patient Spontanous Breathing and Patient connected to face mask oxygen  Post-op Assessment: Report given to PACU RN  Post vital signs: Reviewed and stable  Complications: No apparent anesthesia complications

## 2011-02-22 NOTE — Progress Notes (Signed)
CBG 173. Betha Loa PA notified. Order for Sliding scale insulin-moderate.

## 2011-02-22 NOTE — Anesthesia Preprocedure Evaluation (Addendum)
Anesthesia Evaluation  Patient identified by MRN, date of birth, ID band Patient awake    Reviewed: Allergy & Precautions, H&P , NPO status , Patient's Chart, lab work & pertinent test results, reviewed documented beta blocker date and time   Airway Mallampati: II TM Distance: >3 FB Neck ROM: Full    Dental  (+) Teeth Intact and Dental Advisory Given   Pulmonary          Cardiovascular Exercise Tolerance: Good hypertension, Pt. on medications     Neuro/Psych    GI/Hepatic   Endo/Other  Diabetes mellitus-, Well Controlled, Type 2, Oral Hypoglycemic Agents  Renal/GU    H/o prostatectomy    Musculoskeletal   Abdominal   Peds  Hematology   Anesthesia Other Findings   Reproductive/Obstetrics                           Anesthesia Physical Anesthesia Plan  ASA: III  Anesthesia Plan: General   Post-op Pain Management:    Induction: Intravenous  Airway Management Planned: Oral ETT  Additional Equipment:   Intra-op Plan:   Post-operative Plan:   Informed Consent: I have reviewed the patients History and Physical, chart, labs and discussed the procedure including the risks, benefits and alternatives for the proposed anesthesia with the patient or authorized representative who has indicated his/her understanding and acceptance.   Dental advisory given  Plan Discussed with: Anesthesiologist  Anesthesia Plan Comments:         Anesthesia Quick Evaluation

## 2011-02-23 ENCOUNTER — Encounter (HOSPITAL_COMMUNITY): Payer: Self-pay | Admitting: General Surgery

## 2011-02-23 LAB — GLUCOSE, CAPILLARY: Glucose-Capillary: 138 mg/dL — ABNORMAL HIGH (ref 70–99)

## 2011-02-23 MED ORDER — HYDROCODONE-ACETAMINOPHEN 5-325 MG PO TABS: 1.0000 | ORAL_TABLET | ORAL | Status: AC | PRN

## 2011-02-23 MED ORDER — INFLUENZA VIRUS VACC SPLIT PF IM SUSP: 0.5000 mL | Freq: Once | INTRAMUSCULAR | Status: DC

## 2011-02-23 NOTE — Progress Notes (Signed)
1 Day Post-Op  Subjective: No complaints. Nausea resolved. Pain under good control  Objective: Vital signs in last 24 hours: Temp:  [97.4 F (36.3 C)-98.3 F (36.8 C)] 97.9 F (36.6 C) (12/13 0634) Pulse Rate:  [72-116] 88  (12/13 0634) Resp:  [18-29] 18  (12/13 0634) BP: (114-146)/(61-98) 143/85 mmHg (12/13 0634) SpO2:  [93 %-100 %] 99 % (12/13 0634) Weight:  [246 lb 14.6 oz (112 kg)] 246 lb 14.6 oz (112 kg) (12/12 1351) Last BM Date: 02/21/11  Intake/Output from previous day: 12/12 0701 - 12/13 0700 In: 1600 [I.V.:1600] Out: 651 [Urine:626; Blood:25] Intake/Output this shift:    GI: soft, non-tender; bowel sounds normal; no masses,  no organomegaly Incisions ok  Lab Results:   [facility] 02/20/11 0934  WBC 4.0  HGB 13.5  HCT 41.2  PLT 210   BMET  Basename 02/20/11 0934  NA 139  K 4.1  CL 100  CO2 29  GLUCOSE 122*  BUN 13  CREATININE 1.01  CALCIUM 9.9   PT/INR No results found for this basename: LABPROT:2,INR:2 in the last 72 hours ABG No results found for this basename: PHART:2,PCO2:2,PO2:2,HCO3:2 in the last 72 hours  Studies/Results: No results found.  Anti-infectives: Anti-infectives     Start     Dose/Rate Route Frequency Ordered Stop   02/21/11 1500   ceFAZolin (ANCEF) IVPB 2 g/50 mL premix        2 g 100 mL/hr over 30 Minutes Intravenous 60 min pre-op 02/21/11 1459 02/22/11 0842          Assessment/Plan: s/p Procedure(s): LAPAROSCOPIC VENTRAL HERNIA Advance diet Discharge  LOS: 1 day    TOTH III,PAUL S 02/23/2011

## 2011-02-23 NOTE — Discharge Summary (Signed)
Physician Discharge Summary  Patient ID: Kenneth [name] MRN: [id number] DOB/AGE: [birth year] 55 y.o.  Admit date: 02/22/2011 Discharge date: 02/23/2011  Admission Diagnoses:  Discharge Diagnoses:  Active Problems:  * No active hospital problems. *    Discharged Condition: good  Hospital Course: The patient underwent laparoscopic ventral hernia repair yesterday. He tolerated it well. Now ready for discharge home  Consults: none  Significant Diagnostic Studies: none  Treatments: surgery: Lap ventral hernia repair with mesh  Discharge Exam: Blood pressure 143/85, pulse 88, temperature 97.9 F (36.6 C), temperature source Oral, resp. rate 18, height 5\' 8"  (1.727 m), weight 246 lb 14.6 oz (112 kg), SpO2 99.00%. GI: soft, non-tender; bowel sounds normal; no masses,  no organomegaly  Disposition:    Current Discharge Medication List    CONTINUE these medications which have NOT CHANGED   Details  amLODipine (NORVASC) 10 MG tablet Take 10 mg by mouth.      benazepril-hydrochlorthiazide (LOTENSIN HCT) 20-25 MG per tablet Take 1 tablet by mouth daily.      metFORMIN (GLUCOPHAGE) 500 MG tablet Take 2 tablets (1,000 mg total) by mouth daily with breakfast. Qty: 60 tablet, Refills: 0    potassium chloride SA (K-DUR,KLOR-CON) 20 MEQ tablet      rosuvastatin (CRESTOR) 20 MG tablet Take 20 mg by mouth daily.      tadalafil (CIALIS) 5 MG tablet Take 5 mg by mouth every 3 (three) days.          SignedRobyne Askew 02/23/2011, 6:46 AM

## 2011-02-23 NOTE — Progress Notes (Signed)
Retroactive UR completed on case.

## 2011-03-17 ENCOUNTER — Encounter (INDEPENDENT_AMBULATORY_CARE_PROVIDER_SITE_OTHER): Payer: Self-pay | Admitting: General Surgery

## 2011-03-17 ENCOUNTER — Ambulatory Visit (INDEPENDENT_AMBULATORY_CARE_PROVIDER_SITE_OTHER): Payer: BC Managed Care – PPO | Admitting: General Surgery

## 2011-03-17 DIAGNOSIS — R05 Cough: Secondary | ICD-10-CM

## 2011-03-17 DIAGNOSIS — R059 Cough, unspecified: Secondary | ICD-10-CM | POA: Insufficient documentation

## 2011-03-17 DIAGNOSIS — K439 Ventral hernia without obstruction or gangrene: Secondary | ICD-10-CM

## 2011-03-17 MED ORDER — PSEUDOEPHEDRINE-CODEINE-GG 30-10-100 MG/5ML PO SOLN: 10.0000 mL | Freq: Four times a day (QID) | ORAL | Status: AC | PRN

## 2011-03-17 NOTE — Patient Instructions (Signed)
No lifting for another 3 weeks 

## 2011-03-21 ENCOUNTER — Encounter (INDEPENDENT_AMBULATORY_CARE_PROVIDER_SITE_OTHER): Payer: Self-pay | Admitting: General Surgery

## 2011-03-21 NOTE — Progress Notes (Signed)
Subjective:     Patient ID: Kenneth [name], male   DOB: Apr 30, 1955, 56 y.o.   MRN: [id number]  HPI The patient is a 56 year old white male who is now about 3 weeks out from a laparoscopic ventral hernia repair with mesh. He has no complaints today. He denies any pain. His appetite is good his bowels are working normally.  Review of Systems     Objective:   Physical Exam On exam his abdomen is soft and nontender. His incisions are all healing nicely. His abdominal wall feels solid. There is no palpable evidence for recurrence of the hernia.    Assessment:     3 weeks status post laparoscopic ventral hernia repair with mesh    Plan:     At this point I like him to refrain from lifting anything heavy for about another 3 weeks. As long as he is not doing strenuous work I think he could return to work in the next week or so. We will plan to see him back in about a month

## 2011-03-30 ENCOUNTER — Other Ambulatory Visit (INDEPENDENT_AMBULATORY_CARE_PROVIDER_SITE_OTHER): Payer: BC Managed Care – PPO

## 2011-03-30 DIAGNOSIS — Z0000 Encounter for general adult medical examination without abnormal findings: Secondary | ICD-10-CM

## 2011-03-30 LAB — LIPID PANEL
HDL: 32.4 mg/dL — ABNORMAL LOW (ref >39.00)
LDL Cholesterol: 63 mg/dL (ref 0–99)
Total CHOL/HDL Ratio: 3
Triglycerides: 50 mg/dL (ref 0.0–149.0)

## 2011-03-30 LAB — CBC WITH DIFFERENTIAL/PLATELET
Basophils Absolute: 0 10*3/uL (ref 0.0–0.1)
Basophils Relative: 1 % (ref 0.0–3.0)
Eosinophils Absolute: 0.3 10*3/uL (ref 0.0–0.7)
HCT: 37.9 % — ABNORMAL LOW (ref 39.0–52.0)
Hemoglobin: 12.8 g/dL — ABNORMAL LOW (ref 13.0–17.0)
Lymphocytes Relative: 41.4 % (ref 12.0–46.0)
Lymphs Abs: 1.9 10*3/uL (ref 0.7–4.0)
MCHC: 33.7 g/dL (ref 30.0–36.0)
MCV: 82.8 fl (ref 78.0–100.0)
Neutro Abs: 1.8 10*3/uL (ref 1.4–7.7)
RBC: 4.58 Mil/uL (ref 4.22–5.81)
RDW: 14.9 % — ABNORMAL HIGH (ref 11.5–14.6)

## 2011-03-30 LAB — POCT URINALYSIS DIPSTICK
Ketones, UA: NEGATIVE
Spec Grav, UA: 1.02
Urobilinogen, UA: 1
pH, UA: 7.5

## 2011-03-30 LAB — HEPATIC FUNCTION PANEL
ALT: 13 U/L (ref 0–53)
AST: 30 U/L (ref 0–37)
Bilirubin, Direct: 0.1 mg/dL (ref 0.0–0.3)
Total Bilirubin: 0.4 mg/dL (ref 0.3–1.2)
Total Protein: 7.5 g/dL (ref 6.0–8.3)

## 2011-03-30 LAB — BASIC METABOLIC PANEL
CO2: 29 mEq/L (ref 19–32)
Calcium: 9.3 mg/dL (ref 8.4–10.5)
Chloride: 101 mEq/L (ref 96–112)
Glucose, Bld: 113 mg/dL — ABNORMAL HIGH (ref 70–99)
Potassium: 4.3 mEq/L (ref 3.5–5.1)
Sodium: 139 mEq/L (ref 135–145)

## 2011-03-30 LAB — TSH: TSH: 1.3 u[IU]/mL (ref 0.35–5.50)

## 2011-04-06 ENCOUNTER — Ambulatory Visit (INDEPENDENT_AMBULATORY_CARE_PROVIDER_SITE_OTHER): Payer: BC Managed Care – PPO | Admitting: Internal Medicine

## 2011-04-06 ENCOUNTER — Encounter: Payer: Self-pay | Admitting: Internal Medicine

## 2011-04-06 DIAGNOSIS — I10 Essential (primary) hypertension: Secondary | ICD-10-CM

## 2011-04-06 DIAGNOSIS — E119 Type 2 diabetes mellitus without complications: Secondary | ICD-10-CM

## 2011-04-06 DIAGNOSIS — E785 Hyperlipidemia, unspecified: Secondary | ICD-10-CM

## 2011-04-06 MED ORDER — METFORMIN HCL 1000 MG PO TABS: 1000.0000 mg | ORAL_TABLET | Freq: Two times a day (BID) | ORAL | Status: DC

## 2011-04-06 MED ORDER — ROSUVASTATIN CALCIUM 20 MG PO TABS: 20.0000 mg | ORAL_TABLET | Freq: Every day | ORAL | Status: DC

## 2011-04-06 MED ORDER — TADALAFIL 5 MG PO TABS: 5.0000 mg | ORAL_TABLET | ORAL | Status: DC

## 2011-04-06 MED ORDER — POTASSIUM CHLORIDE CRYS ER 20 MEQ PO TBCR: 20.0000 meq | EXTENDED_RELEASE_TABLET | Freq: Every day | ORAL | Status: DC

## 2011-04-06 MED ORDER — BENAZEPRIL-HYDROCHLOROTHIAZIDE 20-25 MG PO TABS: 1.0000 | ORAL_TABLET | Freq: Every day | ORAL | Status: DC

## 2011-04-06 MED ORDER — AMLODIPINE BESYLATE 10 MG PO TABS: 10.0000 mg | ORAL_TABLET | Freq: Every day | ORAL | Status: DC

## 2011-04-06 NOTE — Progress Notes (Signed)
  Subjective:    Patient ID: Kenneth [name], male    DOB: [birth year], 56 y.o.   MRN: [id number]  HPI  Wt Readings from Last 3 Encounters:  04/06/11 233 lb (105.688 kg)  03/17/11 234 lb 9.6 oz (106.414 kg)  02/22/11 246 lb 14.6 oz (112 kg)   Past Medical History  Diagnosis Date  . Hypertension   . Cancer     prostate- robotic surgery, 2010.  Marland Kitchen Arthritis     ?knees,hips   . Hypercholesteremia   . Hemorrhoids     h/o  . Diabetes mellitus     diagnosed 2008    History   Social History  . Marital Status: Married    Spouse Name: N/A    Number of Children: N/A  . Years of Education: N/A   Occupational History  . Not on file.   Social History Main Topics  . Smoking status: Never Smoker   . Smokeless tobacco: Never Used  . Alcohol Use: 4.2 oz/week    7 Glasses of wine per week  . Drug Use: No  . Sexually Active: No   Other Topics Concern  . Not on file   Social History Narrative  . No narrative on file    Past Surgical History  Procedure Date  . Prostatectomy October 15th, 2010  . Abdominal hernia repair 02/22/11  . Ventral hernia repair 02/22/2011    Procedure: LAPAROSCOPIC VENTRAL HERNIA;  Surgeon: Robyne Askew, MD;  Location: [facility] OR;  Service: General;  Laterality: N/A;  laparoscopic ventral hernia repair with mesh    Family History  Problem Relation Age of Onset  . Hypertension Mother   . Diabetes Father   . Hypertension Father   . Anesthesia problems Neg Hx   . Hypotension Neg Hx   . Malignant hyperthermia Neg Hx   . Pseudochol deficiency Neg Hx     No Known Allergies  Current Outpatient Prescriptions on File Prior to Visit  Medication Sig Dispense Refill  . amLODipine (NORVASC) 10 MG tablet Take 10 mg by mouth.        . benazepril-hydrochlorthiazide (LOTENSIN HCT) 20-25 MG per tablet Take 1 tablet by mouth daily.        . metFORMIN (GLUCOPHAGE) 500 MG tablet Take 2 tablets (1,000 mg total) by mouth daily with breakfast.  60 tablet  0  .  potassium chloride SA (K-DUR,KLOR-CON) 20 MEQ tablet        . rosuvastatin (CRESTOR) 20 MG tablet Take 20 mg by mouth daily.        . tadalafil (CIALIS) 5 MG tablet Take 5 mg by mouth every 3 (three) days.         BP 112/84  Temp(Src) 98.2 F (36.8 C) (Oral)  Ht 5' 6.25" (1.683 m)  Wt 233 lb (105.688 kg)  BMI 37.32 kg/m2      Review of Systems     Objective:   Physical Exam        Assessment & Plan:

## 2011-04-06 NOTE — Patient Instructions (Signed)
Please check your hemoglobin A1c every 3 months  Limit your sodium (Salt) intake  You need to lose weight.  Consider a lower calorie diet and regular exercise. 

## 2011-04-11 ENCOUNTER — Ambulatory Visit (INDEPENDENT_AMBULATORY_CARE_PROVIDER_SITE_OTHER): Payer: BC Managed Care – PPO | Admitting: General Surgery

## 2011-04-11 ENCOUNTER — Encounter (INDEPENDENT_AMBULATORY_CARE_PROVIDER_SITE_OTHER): Payer: Self-pay | Admitting: General Surgery

## 2011-04-11 VITALS — BP 124/88 | HR 92 | Temp 97.5°F | Ht 67.0 in | Wt 238.8 lb

## 2011-04-11 DIAGNOSIS — K439 Ventral hernia without obstruction or gangrene: Secondary | ICD-10-CM

## 2011-04-11 NOTE — Progress Notes (Signed)
Subjective:     Patient ID: Kenneth [name], male   DOB: 04/05/1955, 56 y.o.   MRN: [id number]  HPI The patient is a 56 year old black male who is now 6 weeks out from a laparoscopic ventral hernia repair with mesh. He is doing very well. He has no complaints today. He denies any abdominal tenderness. He is moving around very comfortably. His appetite is good and his bowels are working normally. Review of Systems     Objective:   Physical Exam On exam his abdomen is soft and nontender. He has a small palpable scar along the midline but no evidence of recurrence of the hernia. His incisions have all healed nicely    Assessment:     Status post laparoscopic ventral hernia repair with mesh    Plan:     At this point he can return to his normal activities without any restrictions. We will plan to see him back on a p.r.n. basis

## 2011-04-11 NOTE — Patient Instructions (Signed)
May return to all normal activities without restrictions

## 2011-07-06 ENCOUNTER — Encounter: Payer: Self-pay | Admitting: Internal Medicine

## 2011-07-06 ENCOUNTER — Ambulatory Visit (INDEPENDENT_AMBULATORY_CARE_PROVIDER_SITE_OTHER): Payer: BC Managed Care – PPO | Admitting: Internal Medicine

## 2011-07-06 VITALS — BP 120/82 | Temp 97.9°F | Wt 245.0 lb

## 2011-07-06 DIAGNOSIS — E119 Type 2 diabetes mellitus without complications: Secondary | ICD-10-CM

## 2011-07-06 DIAGNOSIS — E785 Hyperlipidemia, unspecified: Secondary | ICD-10-CM

## 2011-07-06 DIAGNOSIS — I10 Essential (primary) hypertension: Secondary | ICD-10-CM

## 2011-07-06 MED ORDER — ATORVASTATIN CALCIUM 40 MG PO TABS: 40.0000 mg | ORAL_TABLET | Freq: Every day | ORAL | Status: DC

## 2011-07-06 NOTE — Progress Notes (Signed)
  Subjective:    Patient ID: Kenneth [name], male    DOB: December 17, 1955, 56 y.o.   MRN: [id number]  HPI  56 year old patient who is seen today for followup. He has type 2 diabetes. 3 months ago his hemoglobin A1c was slightly elevated at 7.2. Metformin was increased to 1000 mg twice daily however he is only recently within the past few days increase this medication fasting blood sugar 88. He states fasting blood sugars have essentially been normal He has treated hypertension which has been stable He also has dyslipidemia but injected the high cost of Crestor. He has had no statin intolerance.    Review of Systems  Constitutional: Positive for unexpected weight change. Negative for fever, chills, appetite change and fatigue.  HENT: Negative for hearing loss, ear pain, congestion, sore throat, trouble swallowing, neck stiffness, dental problem, voice change and tinnitus.   Eyes: Negative for pain, discharge and visual disturbance.  Respiratory: Negative for cough, chest tightness, wheezing and stridor.   Cardiovascular: Negative for chest pain, palpitations and leg swelling.  Gastrointestinal: Negative for nausea, vomiting, abdominal pain, diarrhea, constipation, blood in stool and abdominal distention.  Genitourinary: Negative for urgency, hematuria, flank pain, discharge, difficulty urinating and genital sores.  Musculoskeletal: Negative for myalgias, back pain, joint swelling, arthralgias and gait problem.  Skin: Negative for rash.  Neurological: Negative for dizziness, syncope, speech difficulty, weakness, numbness and headaches.  Hematological: Negative for adenopathy. Does not bruise/bleed easily.  Psychiatric/Behavioral: Negative for behavioral problems and dysphoric mood. The patient is not nervous/anxious.        Objective:   Physical Exam  Constitutional: He is oriented to person, place, and time. He appears well-developed.       Weight 245 Pressure 120/80  HENT:  Head:  Normocephalic.  Right Ear: External ear normal.  Left Ear: External ear normal.  Eyes: Conjunctivae and EOM are normal.  Neck: Normal range of motion.  Cardiovascular: Normal rate and normal heart sounds.   Pulmonary/Chest: Breath sounds normal.  Abdominal: Bowel sounds are normal.  Musculoskeletal: Normal range of motion. He exhibits no edema and no tenderness.  Neurological: He is alert and oriented to person, place, and time.  Psychiatric: He has a normal mood and affect. His behavior is normal.          Assessment & Plan:   Diabetes. Metformin only recently increased to 2 g daily in divided dosages. We'll wait 3 months to check a hemoglobin A1c Hypertension stable Dyslipidemia. We'll switch to atorvastatin due to cost concerns.

## 2011-07-06 NOTE — Progress Notes (Signed)
  Subjective:    Patient ID: Kenneth [name], male    DOB: 1955-10-10, 56 y.o.   MRN: [id number]  HPI  Wt Readings from Last 3 Encounters:  07/06/11 245 lb (111.131 kg)  04/11/11 238 lb 12.8 oz (108.319 kg)  04/06/11 233 lb (105.688 kg)    Review of Systems     Objective:   Physical Exam        Assessment & Plan:

## 2011-07-06 NOTE — Patient Instructions (Signed)
Limit your sodium (Salt) intake  You need to lose weight.  Consider a lower calorie diet and regular exercise.   Please check your hemoglobin A1c every 3 months   

## 2011-07-14 ENCOUNTER — Telehealth: Payer: Self-pay | Admitting: Internal Medicine

## 2011-07-14 NOTE — Telephone Encounter (Signed)
Pt requesting to have all future refills sent to the pharmacy at the [facility] in [place]

## 2011-07-14 NOTE — Telephone Encounter (Signed)
That is who we have listed currently in med list

## 2011-07-19 ENCOUNTER — Other Ambulatory Visit: Payer: Self-pay

## 2011-07-19 MED ORDER — METFORMIN HCL 1000 MG PO TABS: 1000.0000 mg | ORAL_TABLET | Freq: Two times a day (BID) | ORAL | Status: DC

## 2011-07-19 MED ORDER — AMLODIPINE BESYLATE 10 MG PO TABS: 10.0000 mg | ORAL_TABLET | Freq: Every day | ORAL | Status: DC

## 2011-07-19 MED ORDER — BENAZEPRIL-HYDROCHLOROTHIAZIDE 20-25 MG PO TABS: 1.0000 | ORAL_TABLET | Freq: Every day | ORAL | Status: DC

## 2011-07-19 MED ORDER — POTASSIUM CHLORIDE CRYS ER 20 MEQ PO TBCR: 20.0000 meq | EXTENDED_RELEASE_TABLET | Freq: Every day | ORAL | Status: DC

## 2011-07-19 MED ORDER — TADALAFIL 5 MG PO TABS: 5.0000 mg | ORAL_TABLET | ORAL | Status: DC

## 2011-09-26 ENCOUNTER — Encounter: Payer: Self-pay | Admitting: Internal Medicine

## 2011-09-26 ENCOUNTER — Ambulatory Visit (INDEPENDENT_AMBULATORY_CARE_PROVIDER_SITE_OTHER): Payer: BC Managed Care – PPO | Admitting: Internal Medicine

## 2011-09-26 VITALS — BP 120/80 | Temp 98.1°F | Wt 239.0 lb

## 2011-09-26 DIAGNOSIS — E785 Hyperlipidemia, unspecified: Secondary | ICD-10-CM

## 2011-09-26 DIAGNOSIS — I10 Essential (primary) hypertension: Secondary | ICD-10-CM

## 2011-09-26 DIAGNOSIS — E119 Type 2 diabetes mellitus without complications: Secondary | ICD-10-CM

## 2011-09-26 MED ORDER — POTASSIUM CHLORIDE CRYS ER 20 MEQ PO TBCR: 20.0000 meq | EXTENDED_RELEASE_TABLET | Freq: Every day | ORAL | Status: DC

## 2011-09-26 MED ORDER — ATORVASTATIN CALCIUM 40 MG PO TABS: 40.0000 mg | ORAL_TABLET | Freq: Every day | ORAL | Status: DC

## 2011-09-26 MED ORDER — METFORMIN HCL 1000 MG PO TABS: 1000.0000 mg | ORAL_TABLET | Freq: Two times a day (BID) | ORAL | Status: DC

## 2011-09-26 MED ORDER — AMLODIPINE BESYLATE 10 MG PO TABS: 10.0000 mg | ORAL_TABLET | Freq: Every day | ORAL | Status: DC

## 2011-09-26 MED ORDER — BENAZEPRIL-HYDROCHLOROTHIAZIDE 20-25 MG PO TABS: 1.0000 | ORAL_TABLET | Freq: Every day | ORAL | Status: DC

## 2011-09-26 NOTE — Patient Instructions (Signed)
Please check your hemoglobin A1c every 3 months  You need to lose weight.  Consider a lower calorie diet and regular exercise.    It is important that you exercise regularly, at least 20 minutes 3 to 4 times per week.  If you develop chest pain or shortness of breath seek  medical attention.  Limit your sodium (Salt) intake 

## 2011-09-26 NOTE — Progress Notes (Signed)
Subjective:    Patient ID: Kenneth [name], male    DOB: [birth year], 56 y.o.   MRN: [id number]  HPI  56 year old patient who has type 2 diabetes. He is in quite well today with glycemic control. He is tolerating his medications well. Earlier in the year his metformin dose was up titrated. No concerns or complaints today he is treated hypertension which has been stable and is well controlled today.  Wt Readings from Last 3 Encounters:  09/26/11 239 lb (108.41 kg)  07/06/11 245 lb (111.131 kg)  04/11/11 238 lb 12.8 oz (108.319 kg)   Past Medical History  Diagnosis Date  . Hypertension   . Cancer     prostate- robotic surgery, 2010.  Marland Kitchen Arthritis     ?knees,hips   . Hypercholesteremia   . Hemorrhoids     h/o  . Diabetes mellitus     diagnosed 2008    History   Social History  . Marital Status: Married    Spouse Name: N/A    Number of Children: N/A  . Years of Education: N/A   Occupational History  . Not on file.   Social History Main Topics  . Smoking status: Never Smoker   . Smokeless tobacco: Never Used  . Alcohol Use: 4.2 oz/week    7 Glasses of wine per week  . Drug Use: No  . Sexually Active: No   Other Topics Concern  . Not on file   Social History Narrative  . No narrative on file    Past Surgical History  Procedure Date  . Prostatectomy October 15th, 2010  . Abdominal hernia repair 02/22/11  . Ventral hernia repair 02/22/2011    Procedure: LAPAROSCOPIC VENTRAL HERNIA;  Surgeon: Robyne Askew, MD;  Location: [facility] OR;  Service: General;  Laterality: N/A;  laparoscopic ventral hernia repair with mesh  . Hernia repair 02/22/11    vental hernia    Family History  Problem Relation Age of Onset  . Hypertension Mother   . Diabetes Father   . Hypertension Father   . Anesthesia problems Neg Hx   . Hypotension Neg Hx   . Malignant hyperthermia Neg Hx   . Pseudochol deficiency Neg Hx     No Known Allergies  Current Outpatient Prescriptions on File  Prior to Visit  Medication Sig Dispense Refill  . tadalafil (CIALIS) 5 MG tablet Take 1 tablet (5 mg total) by mouth every 3 (three) days.  10 tablet  5  . DISCONTD: amLODipine (NORVASC) 10 MG tablet Take 1 tablet (10 mg total) by mouth daily.  90 tablet  3  . DISCONTD: atorvastatin (LIPITOR) 40 MG tablet Take 1 tablet (40 mg total) by mouth daily.  90 tablet  3  . DISCONTD: benazepril-hydrochlorthiazide (LOTENSIN HCT) 20-25 MG per tablet Take 1 tablet by mouth daily.  90 tablet  3  . DISCONTD: metFORMIN (GLUCOPHAGE) 1000 MG tablet Take 1 tablet (1,000 mg total) by mouth 2 (two) times daily with a meal.  180 tablet  3  . DISCONTD: potassium chloride SA (K-DUR,KLOR-CON) 20 MEQ tablet Take 1 tablet (20 mEq total) by mouth daily.  90 tablet  4    BP 120/80  Temp 98.1 F (36.7 C) (Oral)  Wt 239 lb (108.41 kg)    Review of Systems  Constitutional: Negative for fever, chills, appetite change and fatigue.  HENT: Negative for hearing loss, ear pain, congestion, sore throat, trouble swallowing, neck stiffness, dental problem, voice change and tinnitus.  Eyes: Negative for pain, discharge and visual disturbance.  Respiratory: Negative for cough, chest tightness, wheezing and stridor.   Cardiovascular: Negative for chest pain, palpitations and leg swelling.  Gastrointestinal: Negative for nausea, vomiting, abdominal pain, diarrhea, constipation, blood in stool and abdominal distention.  Genitourinary: Negative for urgency, hematuria, flank pain, discharge, difficulty urinating and genital sores.  Musculoskeletal: Negative for myalgias, back pain, joint swelling, arthralgias and gait problem.  Skin: Negative for rash.  Neurological: Negative for dizziness, syncope, speech difficulty, weakness, numbness and headaches.  Hematological: Negative for adenopathy. Does not bruise/bleed easily.  Psychiatric/Behavioral: Negative for behavioral problems and dysphoric mood. The patient is not nervous/anxious.         Objective:   Physical Exam  Constitutional: He is oriented to person, place, and time. He appears well-developed.       Blood pressure 120/80  HENT:  Head: Normocephalic.  Right Ear: External ear normal.  Left Ear: External ear normal.  Eyes: Conjunctivae and EOM are normal.  Neck: Normal range of motion.  Cardiovascular: Normal rate and normal heart sounds.   Pulmonary/Chest: Breath sounds normal.  Abdominal: Bowel sounds are normal.  Musculoskeletal: Normal range of motion. He exhibits no edema and no tenderness.  Neurological: He is alert and oriented to person, place, and time.  Psychiatric: He has a normal mood and affect. His behavior is normal.          Assessment & Plan:   Diabetes. Well controlled we'll check a hemoglobin A1c hypertension stable Dyslipidemia. Continue Lipitor  Recheck 3 months  All medications refilled

## 2011-10-05 ENCOUNTER — Ambulatory Visit: Payer: BC Managed Care – PPO | Admitting: Internal Medicine

## 2011-12-27 ENCOUNTER — Ambulatory Visit (INDEPENDENT_AMBULATORY_CARE_PROVIDER_SITE_OTHER): Payer: BC Managed Care – PPO | Admitting: Internal Medicine

## 2011-12-27 ENCOUNTER — Encounter: Payer: Self-pay | Admitting: Internal Medicine

## 2011-12-27 VITALS — BP 130/90 | Temp 98.2°F | Wt 240.0 lb

## 2011-12-27 DIAGNOSIS — Z23 Encounter for immunization: Secondary | ICD-10-CM

## 2011-12-27 DIAGNOSIS — E119 Type 2 diabetes mellitus without complications: Secondary | ICD-10-CM

## 2011-12-27 DIAGNOSIS — E785 Hyperlipidemia, unspecified: Secondary | ICD-10-CM

## 2011-12-27 DIAGNOSIS — I10 Essential (primary) hypertension: Secondary | ICD-10-CM

## 2011-12-27 LAB — HEMOGLOBIN A1C: Hgb A1c MFr Bld: 6.8 % — ABNORMAL HIGH (ref 4.6–6.5)

## 2011-12-27 MED ORDER — FLUTICASONE PROPIONATE 50 MCG/ACT NA SUSP: 2.0000 | Freq: Every day | NASAL | Status: DC

## 2011-12-27 NOTE — Patient Instructions (Signed)
Limit your sodium (Salt) intake   Please check your hemoglobin A1c every 3 months    It is important that you exercise regularly, at least 20 minutes 3 to 4 times per week.  If you develop chest pain or shortness of breath seek  medical attention.  You need to lose weight.  Consider a lower calorie diet and regular exercise. 

## 2011-12-27 NOTE — Progress Notes (Signed)
Subjective:    Patient ID: Kenneth [name], male    DOB: April 16, 1955, 56 y.o.   MRN: [id number]  HPI  56 year old patient who is seen today for followup of hypertension dyslipidemia and type 2 diabetes. No change in weight. Last hemoglobin A1c 7.2. Denies any cardiopulmonary complaints. In general doing quite well.  Wt Readings from Last 3 Encounters:  12/27/11 240 lb (108.863 kg)  09/26/11 239 lb (108.41 kg)  07/06/11 245 lb (111.131 kg)     Past Medical History  Diagnosis Date  . Hypertension   . Cancer     prostate- robotic surgery, 2010.  Marland Kitchen Arthritis     ?knees,hips   . Hypercholesteremia   . Hemorrhoids     h/o  . Diabetes mellitus     diagnosed 2008    History   Social History  . Marital Status: Married    Spouse Name: N/A    Number of Children: N/A  . Years of Education: N/A   Occupational History  . Not on file.   Social History Main Topics  . Smoking status: Never Smoker   . Smokeless tobacco: Never Used  . Alcohol Use: 4.2 oz/week    7 Glasses of wine per week  . Drug Use: No  . Sexually Active: No   Other Topics Concern  . Not on file   Social History Narrative  . No narrative on file    Past Surgical History  Procedure Date  . Prostatectomy October 15th, 2010  . Abdominal hernia repair 02/22/11  . Ventral hernia repair 02/22/2011    Procedure: LAPAROSCOPIC VENTRAL HERNIA;  Surgeon: Robyne Askew, MD;  Location: [facility] OR;  Service: General;  Laterality: N/A;  laparoscopic ventral hernia repair with mesh  . Hernia repair 02/22/11    vental hernia    Family History  Problem Relation Age of Onset  . Hypertension Mother   . Diabetes Father   . Hypertension Father   . Anesthesia problems Neg Hx   . Hypotension Neg Hx   . Malignant hyperthermia Neg Hx   . Pseudochol deficiency Neg Hx     No Known Allergies  Current Outpatient Prescriptions on File Prior to Visit  Medication Sig Dispense Refill  . amLODipine (NORVASC) 10 MG tablet Take 1  tablet (10 mg total) by mouth daily.  90 tablet  3  . atorvastatin (LIPITOR) 40 MG tablet Take 1 tablet (40 mg total) by mouth daily.  90 tablet  3  . benazepril-hydrochlorthiazide (LOTENSIN HCT) 20-25 MG per tablet Take 1 tablet by mouth daily.  90 tablet  3  . metFORMIN (GLUCOPHAGE) 1000 MG tablet Take 1 tablet (1,000 mg total) by mouth 2 (two) times daily with a meal.  180 tablet  3  . potassium chloride SA (K-DUR,KLOR-CON) 20 MEQ tablet Take 1 tablet (20 mEq total) by mouth daily.  90 tablet  4  . tadalafil (CIALIS) 5 MG tablet Take 1 tablet (5 mg total) by mouth every 3 (three) days.  10 tablet  5    BP 130/90  Temp 98.2 F (36.8 C) (Oral)  Wt 240 lb (108.863 kg)   Review of Systems  Constitutional: Negative for fever, chills, appetite change and fatigue.  HENT: Negative for hearing loss, ear pain, congestion, sore throat, trouble swallowing, neck stiffness, dental problem, voice change and tinnitus.   Eyes: Negative for pain, discharge and visual disturbance.  Respiratory: Negative for cough, chest tightness, wheezing and stridor.   Cardiovascular: Negative for  chest pain, palpitations and leg swelling.  Gastrointestinal: Negative for nausea, vomiting, abdominal pain, diarrhea, constipation, blood in stool and abdominal distention.  Genitourinary: Negative for urgency, hematuria, flank pain, discharge, difficulty urinating and genital sores.  Musculoskeletal: Negative for myalgias, back pain, joint swelling, arthralgias and gait problem.  Skin: Negative for rash.  Neurological: Negative for dizziness, syncope, speech difficulty, weakness, numbness and headaches.  Hematological: Negative for adenopathy. Does not bruise/bleed easily.  Psychiatric/Behavioral: Negative for behavioral problems and dysphoric mood. The patient is not nervous/anxious.        Objective:   Physical Exam  Constitutional: He is oriented to person, place, and time. He appears well-developed.       Repeat  blood pressure 120/80  HENT:  Head: Normocephalic.  Right Ear: External ear normal.  Left Ear: External ear normal.  Eyes: Conjunctivae normal and EOM are normal.  Neck: Normal range of motion.  Cardiovascular: Normal rate and normal heart sounds.   Pulmonary/Chest: Breath sounds normal.  Abdominal: Bowel sounds are normal.  Musculoskeletal: Normal range of motion. He exhibits no edema and no tenderness.  Neurological: He is alert and oriented to person, place, and time.  Psychiatric: He has a normal mood and affect. His behavior is normal.          Assessment & Plan:   DM2 HTN HLD  Check hgha1c

## 2012-03-28 ENCOUNTER — Encounter: Payer: Self-pay | Admitting: Internal Medicine

## 2012-03-28 ENCOUNTER — Ambulatory Visit (INDEPENDENT_AMBULATORY_CARE_PROVIDER_SITE_OTHER): Payer: BC Managed Care – PPO | Admitting: Internal Medicine

## 2012-03-28 VITALS — BP 130/88 | HR 83 | Temp 98.2°F | Resp 18 | Wt 243.0 lb

## 2012-03-28 DIAGNOSIS — I10 Essential (primary) hypertension: Secondary | ICD-10-CM

## 2012-03-28 DIAGNOSIS — E119 Type 2 diabetes mellitus without complications: Secondary | ICD-10-CM

## 2012-03-28 MED ORDER — POTASSIUM CHLORIDE CRYS ER 20 MEQ PO TBCR: 20.0000 meq | EXTENDED_RELEASE_TABLET | Freq: Every day | ORAL | Status: DC

## 2012-03-28 MED ORDER — BENAZEPRIL-HYDROCHLOROTHIAZIDE 20-25 MG PO TABS: 1.0000 | ORAL_TABLET | Freq: Every day | ORAL | Status: DC

## 2012-03-28 MED ORDER — ATORVASTATIN CALCIUM 40 MG PO TABS: 40.0000 mg | ORAL_TABLET | Freq: Every day | ORAL | Status: DC

## 2012-03-28 MED ORDER — FLUTICASONE PROPIONATE 50 MCG/ACT NA SUSP: 2.0000 | Freq: Every day | NASAL | Status: DC

## 2012-03-28 MED ORDER — AMLODIPINE BESYLATE 10 MG PO TABS: 10.0000 mg | ORAL_TABLET | Freq: Every day | ORAL | Status: DC

## 2012-03-28 MED ORDER — METFORMIN HCL 1000 MG PO TABS: 1000.0000 mg | ORAL_TABLET | Freq: Two times a day (BID) | ORAL | Status: DC

## 2012-03-28 NOTE — Patient Instructions (Signed)
Limit your sodium (Salt) intake  You need to lose weight.  Consider a lower calorie diet and regular exercise.    It is important that you exercise regularly, at least 20 minutes 3 to 4 times per week.  If you develop chest pain or shortness of breath seek  medical attention.   

## 2012-03-28 NOTE — Progress Notes (Signed)
  Subjective:    Patient ID: Kenneth [name], male    DOB: December 02, 1955, 57 y.o.   MRN: [id number]  HPI  57 year old patient who is seen today for followup. He has treated hypertension dyslipidemia and type 2 diabetes. Last hemoglobin A1c 6.8. He is doing quite well today without concerns or complaints. Denies any cardiopulmonary complaints there is been some modest weight gain.  BP Readings from Last 3 Encounters:  03/28/12 130/88  12/27/11 130/90  09/26/11 120/80    Wt Readings from Last 3 Encounters:  03/28/12 243 lb (110.224 kg)  12/27/11 240 lb (108.863 kg)  09/26/11 239 lb (108.41 kg)    Review of Systems  Constitutional: Negative for fever, chills, appetite change and fatigue.  HENT: Negative for hearing loss, ear pain, congestion, sore throat, trouble swallowing, neck stiffness, dental problem, voice change and tinnitus.   Eyes: Negative for pain, discharge and visual disturbance.  Respiratory: Negative for cough, chest tightness, wheezing and stridor.   Cardiovascular: Negative for chest pain, palpitations and leg swelling.  Gastrointestinal: Negative for nausea, vomiting, abdominal pain, diarrhea, constipation, blood in stool and abdominal distention.  Genitourinary: Negative for urgency, hematuria, flank pain, discharge, difficulty urinating and genital sores.  Musculoskeletal: Negative for myalgias, back pain, joint swelling, arthralgias and gait problem.  Skin: Negative for rash.  Neurological: Negative for dizziness, syncope, speech difficulty, weakness, numbness and headaches.  Hematological: Negative for adenopathy. Does not bruise/bleed easily.  Psychiatric/Behavioral: Negative for behavioral problems and dysphoric mood. The patient is not nervous/anxious.        Objective:   Physical Exam  Constitutional: He is oriented to person, place, and time. He appears well-developed.       Repeat blood pressure 120/80  HENT:  Head: Normocephalic.  Right Ear: External ear  normal.  Left Ear: External ear normal.  Eyes: Conjunctivae normal and EOM are normal.  Neck: Normal range of motion.  Cardiovascular: Normal rate and normal heart sounds.   Pulmonary/Chest: Breath sounds normal.  Abdominal: Bowel sounds are normal.  Musculoskeletal: Normal range of motion. He exhibits no edema and no tenderness.  Neurological: He is alert and oriented to person, place, and time.  Psychiatric: He has a normal mood and affect. His behavior is normal.          Assessment & Plan:   Hypertension stable Diabetes mellitus. Will check a hemoglobin A1c Exogenous obesity. Weight loss encouraged Dyslipidemia  All medications refilled CPX 3 months

## 2012-03-29 ENCOUNTER — Telehealth: Payer: Self-pay | Admitting: Internal Medicine

## 2012-03-29 NOTE — Telephone Encounter (Signed)
Caller: Kenneth [name]/Patient; Phone: [phone]; Reason for Call: He missed call from office I found message about his A1C having increased I gave him the message and he said he will work on his eating and the wght loss and exercise

## 2012-04-30 ENCOUNTER — Other Ambulatory Visit: Payer: Self-pay | Admitting: Internal Medicine

## 2012-06-20 ENCOUNTER — Other Ambulatory Visit (INDEPENDENT_AMBULATORY_CARE_PROVIDER_SITE_OTHER): Payer: BC Managed Care – PPO

## 2012-06-20 DIAGNOSIS — Z0000 Encounter for general adult medical examination without abnormal findings: Secondary | ICD-10-CM

## 2012-06-20 LAB — MICROALBUMIN / CREATININE URINE RATIO: Microalb, Ur: 0.6 mg/dL (ref 0.0–1.9)

## 2012-06-20 LAB — BASIC METABOLIC PANEL
BUN: 12 mg/dL (ref 6–23)
Chloride: 101 mEq/L (ref 96–112)
Potassium: 4.4 mEq/L (ref 3.5–5.1)

## 2012-06-20 LAB — POCT URINALYSIS DIPSTICK
Bilirubin, UA: NEGATIVE
Glucose, UA: NEGATIVE
Ketones, UA: NEGATIVE
Leukocytes, UA: NEGATIVE
Nitrite, UA: NEGATIVE
pH, UA: 6.5

## 2012-06-20 LAB — LIPID PANEL
HDL: 33.1 mg/dL — ABNORMAL LOW (ref >39.00)
Triglycerides: 70 mg/dL (ref 0.0–149.0)
VLDL: 14 mg/dL (ref 0.0–40.0)

## 2012-06-20 LAB — CBC WITH DIFFERENTIAL/PLATELET
Basophils Relative: 1 % (ref 0.0–3.0)
Eosinophils Relative: 4.2 % (ref 0.0–5.0)
HCT: 40.2 % (ref 39.0–52.0)
Lymphs Abs: 1.7 10*3/uL (ref 0.7–4.0)
MCV: 82.7 fl (ref 78.0–100.0)
Monocytes Absolute: 0.4 10*3/uL (ref 0.1–1.0)
Neutro Abs: 1.5 10*3/uL (ref 1.4–7.7)
Platelets: 252 10*3/uL (ref 150.0–400.0)
RBC: 4.87 Mil/uL (ref 4.22–5.81)
WBC: 3.8 10*3/uL — ABNORMAL LOW (ref 4.5–10.5)

## 2012-06-20 LAB — HEPATIC FUNCTION PANEL
ALT: 13 U/L (ref 0–53)
Albumin: 4.4 g/dL (ref 3.5–5.2)
Total Protein: 7.8 g/dL (ref 6.0–8.3)

## 2012-06-20 LAB — HEMOGLOBIN A1C: Hgb A1c MFr Bld: 6.5 % (ref 4.6–6.5)

## 2012-06-27 ENCOUNTER — Encounter: Payer: Self-pay | Admitting: Internal Medicine

## 2012-06-27 ENCOUNTER — Ambulatory Visit (INDEPENDENT_AMBULATORY_CARE_PROVIDER_SITE_OTHER): Payer: BC Managed Care – PPO | Admitting: Internal Medicine

## 2012-06-27 VITALS — BP 120/80 | HR 88 | Temp 97.8°F | Resp 20 | Ht 66.0 in | Wt 223.0 lb

## 2012-06-27 DIAGNOSIS — E119 Type 2 diabetes mellitus without complications: Secondary | ICD-10-CM

## 2012-06-27 DIAGNOSIS — Z0000 Encounter for general adult medical examination without abnormal findings: Secondary | ICD-10-CM

## 2012-06-27 DIAGNOSIS — I10 Essential (primary) hypertension: Secondary | ICD-10-CM

## 2012-06-27 MED ORDER — ATORVASTATIN CALCIUM 40 MG PO TABS: 40.0000 mg | ORAL_TABLET | Freq: Every day | ORAL | Status: DC

## 2012-06-27 MED ORDER — TADALAFIL 5 MG PO TABS: ORAL_TABLET | ORAL | Status: DC

## 2012-06-27 MED ORDER — AMLODIPINE BESYLATE 10 MG PO TABS: 10.0000 mg | ORAL_TABLET | Freq: Every day | ORAL | Status: DC

## 2012-06-27 MED ORDER — BENAZEPRIL-HYDROCHLOROTHIAZIDE 20-25 MG PO TABS: 1.0000 | ORAL_TABLET | Freq: Every day | ORAL | Status: DC

## 2012-06-27 MED ORDER — POTASSIUM CHLORIDE CRYS ER 20 MEQ PO TBCR: 20.0000 meq | EXTENDED_RELEASE_TABLET | Freq: Every day | ORAL | Status: DC

## 2012-06-27 MED ORDER — FLUTICASONE PROPIONATE 50 MCG/ACT NA SUSP: 2.0000 | Freq: Every day | NASAL | Status: DC

## 2012-06-27 NOTE — Progress Notes (Signed)
Subjective:    Patient ID: Kenneth [name], male    DOB: 1955-08-18, 57 y.o.   MRN: [id number]  HPI  58 year old patient who is seen today for a preventive health examination. Over the last 3 months she has had a 20 pound voluntary weight loss do to better dietary habits. He also gets to his health club 2-4 times per week. He has type 2 diabetes which has been much improved. He has history of dyslipidemia controlled on statin therapy. He has treated hypertension which has been stable there is a remote history of gout. Done quite well today.  Past Medical History  Diagnosis Date  . Hypertension   . Cancer     prostate- robotic surgery, 2010.  Marland Kitchen Arthritis     ?knees,hips   . Hypercholesteremia   . Hemorrhoids     h/o  . Diabetes mellitus     diagnosed 2008    History   Social History  . Marital Status: Married    Spouse Name: N/A    Number of Children: N/A  . Years of Education: N/A   Occupational History  . Not on file.   Social History Main Topics  . Smoking status: Never Smoker   . Smokeless tobacco: Never Used  . Alcohol Use: 4.2 oz/week    7 Glasses of wine per week  . Drug Use: No  . Sexually Active: No   Other Topics Concern  . Not on file   Social History Narrative  . No narrative on file    Past Surgical History  Procedure Laterality Date  . Prostatectomy  October 15th, 2010  . Abdominal hernia repair  02/22/11  . Ventral hernia repair  02/22/2011    Procedure: LAPAROSCOPIC VENTRAL HERNIA;  Surgeon: Robyne Askew, MD;  Location: [facility] OR;  Service: General;  Laterality: N/A;  laparoscopic ventral hernia repair with mesh  . Hernia repair  02/22/11    vental hernia    Family History  Problem Relation Age of Onset  . Hypertension Mother   . Diabetes Father   . Hypertension Father   . Anesthesia problems Neg Hx   . Hypotension Neg Hx   . Malignant hyperthermia Neg Hx   . Pseudochol deficiency Neg Hx     No Known Allergies  Current Outpatient  Prescriptions on File Prior to Visit  Medication Sig Dispense Refill  . amLODipine (NORVASC) 10 MG tablet Take 1 tablet (10 mg total) by mouth daily.  90 tablet  3  . atorvastatin (LIPITOR) 40 MG tablet Take 1 tablet (40 mg total) by mouth daily.  90 tablet  3  . benazepril-hydrochlorthiazide (LOTENSIN HCT) 20-25 MG per tablet Take 1 tablet by mouth daily.  90 tablet  3  . CIALIS 5 MG tablet TAKE 1 TABLET EVERY 3 DAYS  10 tablet  3  . fluticasone (FLONASE) 50 MCG/ACT nasal spray Place 2 sprays into the nose daily.  16 g  6  . metFORMIN (GLUCOPHAGE) 1000 MG tablet Take 1 tablet (1,000 mg total) by mouth 2 (two) times daily with a meal.  180 tablet  3  . potassium chloride SA (K-DUR,KLOR-CON) 20 MEQ tablet Take 1 tablet (20 mEq total) by mouth daily.  90 tablet  4   No current facility-administered medications on file prior to visit.    BP 120/80  Pulse 88  Temp(Src) 97.8 F (36.6 C) (Oral)  Resp 20  Ht 5\' 6"  (1.676 m)  Wt 223 lb (101.152 kg)  BMI 36.01 kg/m2  SpO2 97%     Wt Readings from Last 3 Encounters:  06/27/12 223 lb (101.152 kg)  03/28/12 243 lb (110.224 kg)  12/27/11 240 lb (108.863 kg)    Review of Systems  Constitutional: Negative for fever, chills, activity change, appetite change and fatigue.  HENT: Negative for hearing loss, ear pain, congestion, rhinorrhea, sneezing, mouth sores, trouble swallowing, neck pain, neck stiffness, dental problem, voice change, sinus pressure and tinnitus.   Eyes: Negative for photophobia, pain, redness and visual disturbance.  Respiratory: Negative for apnea, cough, choking, chest tightness, shortness of breath and wheezing.   Cardiovascular: Negative for chest pain, palpitations and leg swelling.  Gastrointestinal: Negative for nausea, vomiting, abdominal pain, diarrhea, constipation, blood in stool, abdominal distention, anal bleeding and rectal pain.  Genitourinary: Negative for dysuria, urgency, frequency, hematuria, flank pain,  decreased urine volume, discharge, penile swelling, scrotal swelling, difficulty urinating, genital sores and testicular pain.  Musculoskeletal: Negative for myalgias, back pain, joint swelling, arthralgias and gait problem.  Skin: Negative for color change, rash and wound.  Neurological: Negative for dizziness, tremors, seizures, syncope, facial asymmetry, speech difficulty, weakness, light-headedness, numbness and headaches.  Hematological: Negative for adenopathy. Does not bruise/bleed easily.  Psychiatric/Behavioral: Negative for suicidal ideas, hallucinations, behavioral problems, confusion, sleep disturbance, self-injury, dysphoric mood, decreased concentration and agitation. The patient is not nervous/anxious.        Objective:   Physical Exam  Constitutional: He appears well-developed and well-nourished.  HENT:  Head: Normocephalic and atraumatic.  Right Ear: External ear normal.  Left Ear: External ear normal.  Nose: Nose normal.  Mouth/Throat: Oropharynx is clear and moist.  Eyes: Conjunctivae and EOM are normal. Pupils are equal, round, and reactive to light. No scleral icterus.  Neck: Normal range of motion. Neck supple. No JVD present. No thyromegaly present.  Cardiovascular: Regular rhythm, normal heart sounds and intact distal pulses.  Exam reveals no gallop and no friction rub.   No murmur heard. Pulmonary/Chest: Effort normal and breath sounds normal. He exhibits no tenderness.  Abdominal: Soft. Bowel sounds are normal. He exhibits no distension and no mass. There is no tenderness.  Genitourinary: Penis normal.  Right testicular atrophy  Musculoskeletal: Normal range of motion. He exhibits no edema and no tenderness.  Lymphadenopathy:    He has no cervical adenopathy.  Neurological: He is alert. He has normal reflexes. No cranial nerve deficit. Coordination normal.  Skin: Skin is warm and dry. No rash noted.  Psychiatric: He has a normal mood and affect. His behavior  is normal.          Assessment & Plan:   Preventive health examination Diabetes mellitus. Hemoglobin A1c 6.5 Hypertension well controlled. Blood pressure 118/78 Obesity. Improved  Continue present regimen Recheck 4 months

## 2012-06-27 NOTE — Patient Instructions (Addendum)
Limit your sodium (Salt) intake    It is important that you exercise regularly, at least 20 minutes 3 to 4 times per week.  If you develop chest pain or shortness of breath seek  medical attention.   Please check your hemoglobin A1c every 3 months   

## 2012-10-10 ENCOUNTER — Other Ambulatory Visit: Payer: Self-pay

## 2012-10-10 MED ORDER — METFORMIN HCL 1000 MG PO TABS: 1000.0000 mg | ORAL_TABLET | Freq: Two times a day (BID) | ORAL | Status: DC

## 2012-10-28 ENCOUNTER — Encounter: Payer: Self-pay | Admitting: Internal Medicine

## 2012-10-28 ENCOUNTER — Ambulatory Visit (INDEPENDENT_AMBULATORY_CARE_PROVIDER_SITE_OTHER): Payer: BC Managed Care – PPO | Admitting: Internal Medicine

## 2012-10-28 VITALS — BP 124/80 | HR 97 | Temp 98.4°F | Resp 20 | Wt 228.0 lb

## 2012-10-28 DIAGNOSIS — I10 Essential (primary) hypertension: Secondary | ICD-10-CM

## 2012-10-28 DIAGNOSIS — E119 Type 2 diabetes mellitus without complications: Secondary | ICD-10-CM

## 2012-10-28 NOTE — Progress Notes (Signed)
  Subjective:    Patient ID: Kenneth [name], male    DOB: 03-03-1956, 57 y.o.   MRN: [id number]  HPI  Wt Readings from Last 3 Encounters:  10/28/12 228 lb (103.42 kg)  06/27/12 223 lb (101.152 kg)  03/28/12 243 lb (110.224 kg)    Review of Systems     Objective:   Physical Exam        Assessment & Plan:

## 2012-10-28 NOTE — Patient Instructions (Signed)
Limit your sodium (Salt) intake   Please check your hemoglobin A1c every 3 months    It is important that you exercise regularly, at least 20 minutes 3 to 4 times per week.  If you develop chest pain or shortness of breath seek  medical attention.  You need to lose weight.  Consider a lower calorie diet and regular exercise. 

## 2012-10-28 NOTE — Progress Notes (Signed)
Subjective:    Patient ID: Kenneth [name], male    DOB: 1955/08/08, 57 y.o.   MRN: [id number]  HPI   57 year old patient who is seen today for followup of hypertension and type 2 diabetes. His hemoglobin a1.c. 4 months ago was 6.5 after a 20 pound weight loss. He is doing quite well today. There has been some modest weight gain. He has treated hypertension and dyslipidemia. Denies any cardiopulmonary complaints.  Past Medical History  Diagnosis Date  . Hypertension   . Cancer     prostate- robotic surgery, 2010.  Marland Kitchen Arthritis     ?knees,hips   . Hypercholesteremia   . Hemorrhoids     h/o  . Diabetes mellitus     diagnosed 2008    History   Social History  . Marital Status: Married    Spouse Name: N/A    Number of Children: N/A  . Years of Education: N/A   Occupational History  . Not on file.   Social History Main Topics  . Smoking status: Never Smoker   . Smokeless tobacco: Never Used  . Alcohol Use: 4.2 oz/week    7 Glasses of wine per week  . Drug Use: No  . Sexual Activity: No   Other Topics Concern  . Not on file   Social History Narrative  . No narrative on file    Past Surgical History  Procedure Laterality Date  . Prostatectomy  October 15th, 2010  . Abdominal hernia repair  02/22/11  . Ventral hernia repair  02/22/2011    Procedure: LAPAROSCOPIC VENTRAL HERNIA;  Surgeon: Robyne Askew, MD;  Location: [facility] OR;  Service: General;  Laterality: N/A;  laparoscopic ventral hernia repair with mesh  . Hernia repair  02/22/11    vental hernia    Family History  Problem Relation Age of Onset  . Hypertension Mother   . Diabetes Father   . Hypertension Father   . Anesthesia problems Neg Hx   . Hypotension Neg Hx   . Malignant hyperthermia Neg Hx   . Pseudochol deficiency Neg Hx     No Known Allergies  Current Outpatient Prescriptions on File Prior to Visit  Medication Sig Dispense Refill  . amLODipine (NORVASC) 10 MG tablet Take 1 tablet (10 mg  total) by mouth daily.  90 tablet  3  . atorvastatin (LIPITOR) 40 MG tablet Take 1 tablet (40 mg total) by mouth daily.  90 tablet  3  . benazepril-hydrochlorthiazide (LOTENSIN HCT) 20-25 MG per tablet Take 1 tablet by mouth daily.  90 tablet  3  . cetirizine (ZYRTEC) 10 MG tablet Take 10 mg by mouth daily.      . fluticasone (FLONASE) 50 MCG/ACT nasal spray Place 2 sprays into the nose daily.  16 g  6  . metFORMIN (GLUCOPHAGE) 1000 MG tablet Take 1 tablet (1,000 mg total) by mouth 2 (two) times daily with a meal.  180 tablet  3  . OVER THE COUNTER MEDICATION Take 1 tablet by mouth daily. Mega Red Joint      . potassium chloride SA (K-DUR,KLOR-CON) 20 MEQ tablet Take 1 tablet (20 mEq total) by mouth daily.  90 tablet  4  . tadalafil (CIALIS) 5 MG tablet TAKE 1 TABLET EVERY 3 DAYS  10 tablet  3   No current facility-administered medications on file prior to visit.    BP 124/80  Pulse 97  Temp(Src) 98.4 F (36.9 C) (Oral)  Resp 20  Wt 228  lb (103.42 kg)  BMI 36.82 kg/m2  SpO2 97%     Review of Systems  Constitutional: Negative for fever, chills, appetite change and fatigue.  HENT: Negative for hearing loss, ear pain, congestion, sore throat, trouble swallowing, neck stiffness, dental problem, voice change and tinnitus.   Eyes: Negative for pain, discharge and visual disturbance.  Respiratory: Negative for cough, chest tightness, wheezing and stridor.   Cardiovascular: Negative for chest pain, palpitations and leg swelling.  Gastrointestinal: Negative for nausea, vomiting, abdominal pain, diarrhea, constipation, blood in stool and abdominal distention.  Genitourinary: Negative for urgency, hematuria, flank pain, discharge, difficulty urinating and genital sores.  Musculoskeletal: Negative for myalgias, back pain, joint swelling, arthralgias and gait problem.  Skin: Negative for rash.  Neurological: Negative for dizziness, syncope, speech difficulty, weakness, numbness and headaches.   Hematological: Negative for adenopathy. Does not bruise/bleed easily.  Psychiatric/Behavioral: Negative for behavioral problems and dysphoric mood. The patient is not nervous/anxious.        Objective:   Physical Exam  Constitutional: He is oriented to person, place, and time. He appears well-developed.  HENT:  Head: Normocephalic.  Right Ear: External ear normal.  Left Ear: External ear normal.  Eyes: Conjunctivae and EOM are normal.  Neck: Normal range of motion.  Cardiovascular: Normal rate and normal heart sounds.   Pulmonary/Chest: Breath sounds normal.  Abdominal: Bowel sounds are normal.  Musculoskeletal: Normal range of motion. He exhibits no edema and no tenderness.  Neurological: He is alert and oriented to person, place, and time.  Psychiatric: He has a normal mood and affect. His behavior is normal.          Assessment & Plan:   Diabetes mellitus. Appears to be stable and better controlled. We'll check a hemoglobin A1c Hypertension well controlled   Recheck 4 months

## 2012-10-29 LAB — HEMOGLOBIN A1C: Hgb A1c MFr Bld: 6.8 % — ABNORMAL HIGH (ref 4.6–6.5)

## 2012-11-25 LAB — HM DIABETES EYE EXAM

## 2012-12-10 ENCOUNTER — Encounter: Payer: Self-pay | Admitting: Internal Medicine

## 2013-01-15 IMAGING — CR DG CHEST 2V
2 series · 2 of 2 positions shown · non-contrast
Comparison: 12/18/2008

CLINICAL DATA: Preoperative assessment for ventral hernia repair,
history hypertension, diabetes, dry cough

CHEST - 2 VIEW

[view not recorded (1 of 2)]
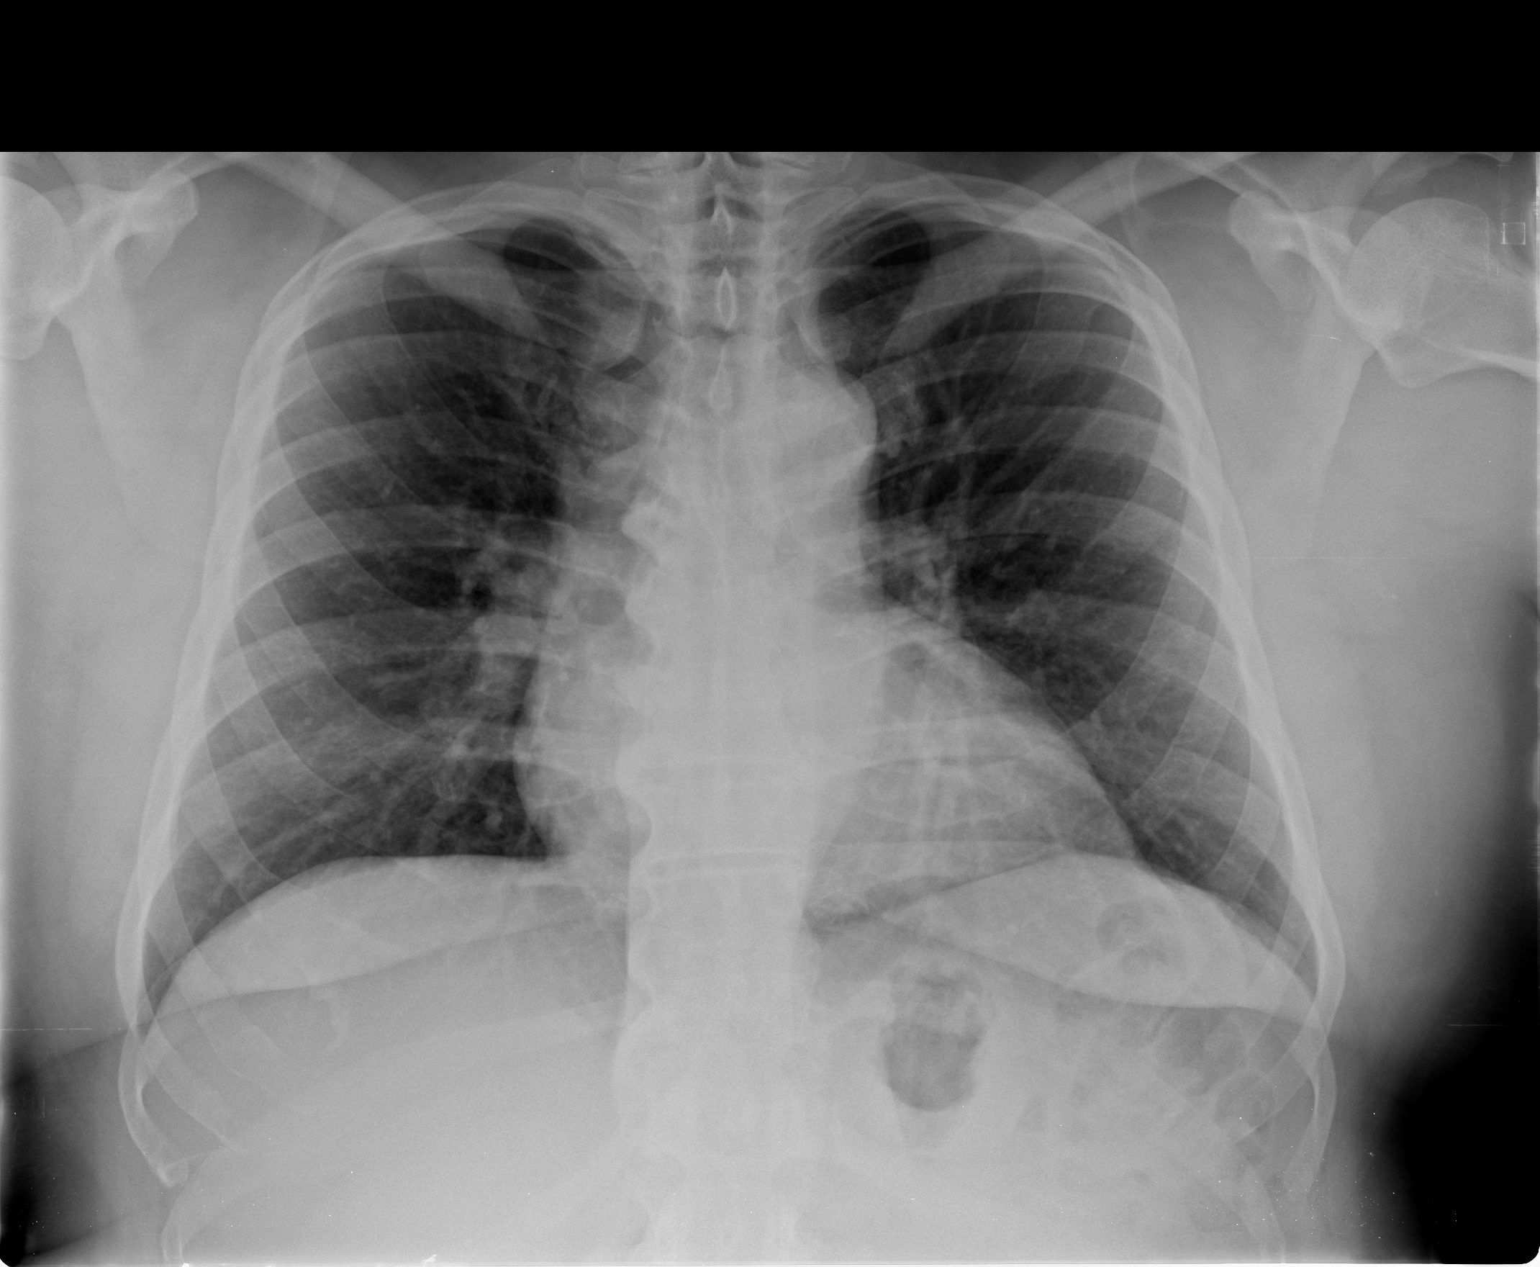

[view not recorded (2 of 2)]
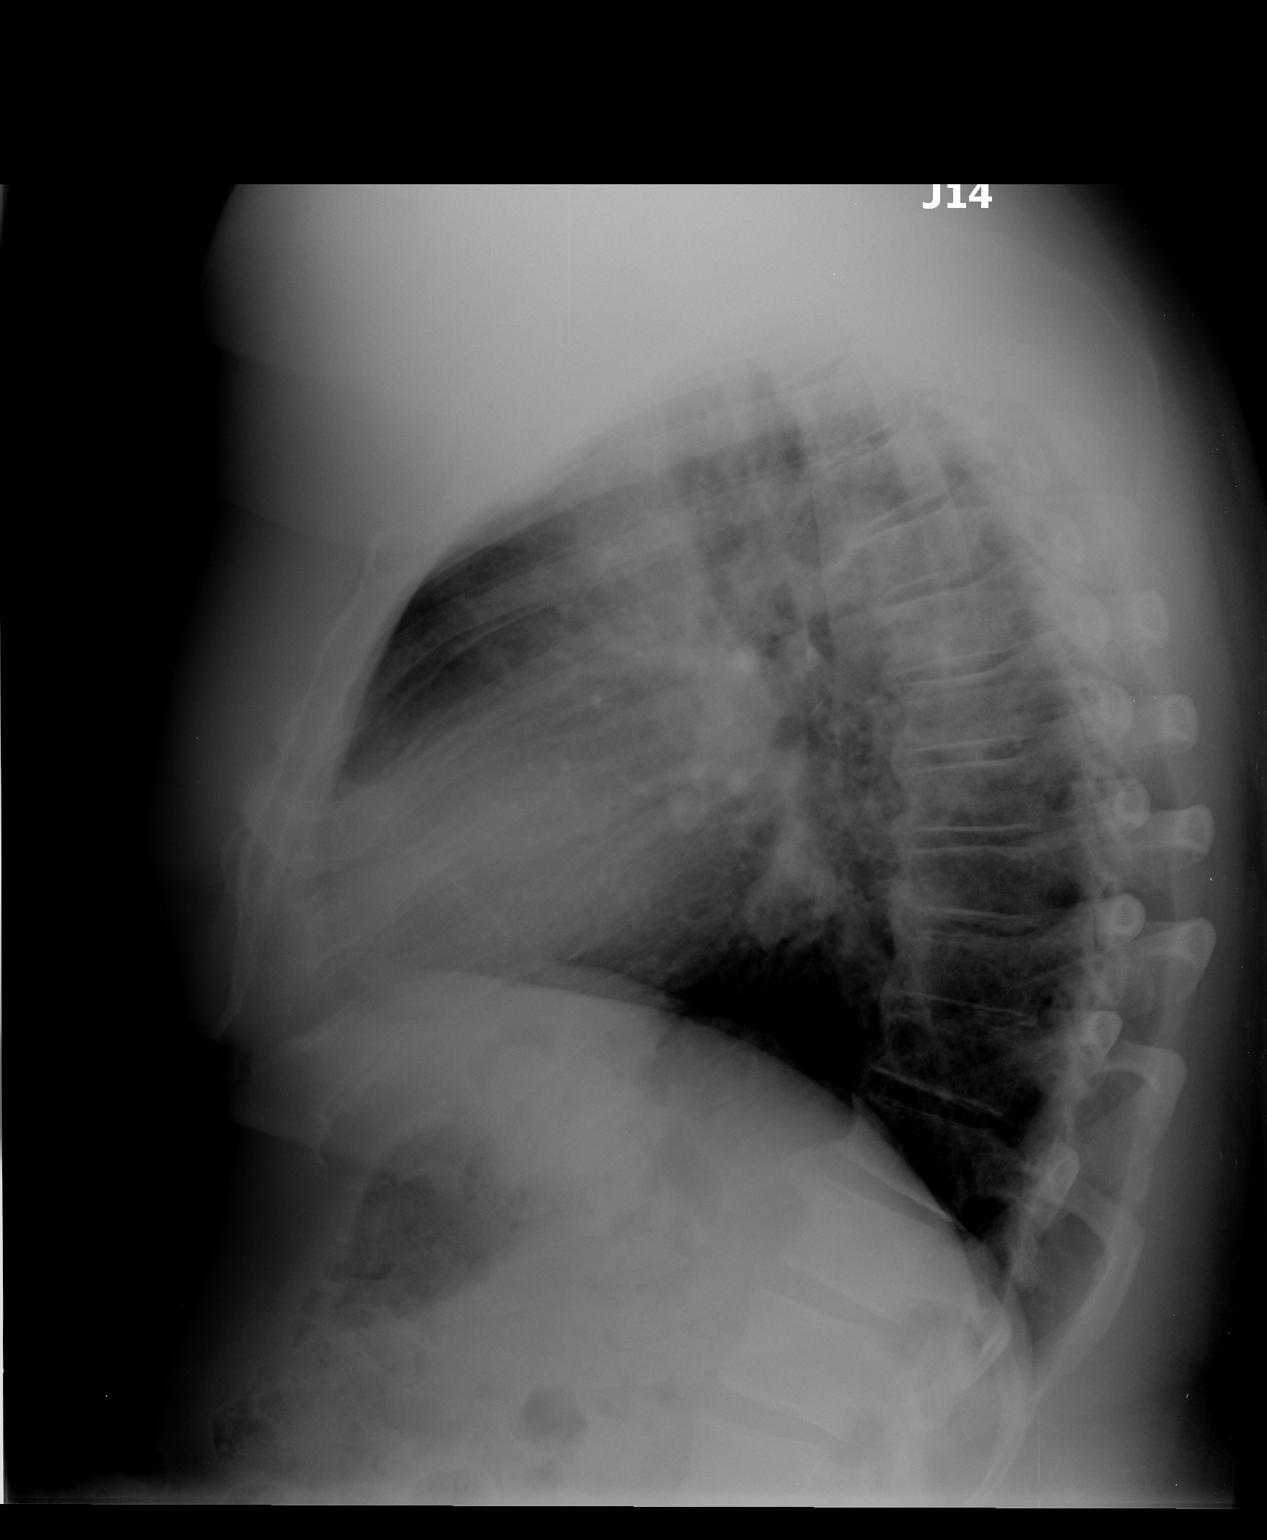

[2 of 2 positions shown; findings below may reference images not displayed]

FINDINGS: Borderline enlargement of cardiac silhouette.
Tortuous aorta.
Pulmonary vascularity normal.
Lungs clear.
No pleural effusion or pneumothorax.
Multilevel endplate spur formation thoracic spine.
IMPRESSION: No acute abnormalities.
Borderline enlargement of cardiac silhouette.

## 2013-01-28 ENCOUNTER — Ambulatory Visit (INDEPENDENT_AMBULATORY_CARE_PROVIDER_SITE_OTHER): Payer: BC Managed Care – PPO | Admitting: Internal Medicine

## 2013-01-28 ENCOUNTER — Encounter: Payer: Self-pay | Admitting: Internal Medicine

## 2013-01-28 VITALS — BP 124/80 | HR 102 | Temp 98.0°F | Resp 20 | Wt 231.0 lb

## 2013-01-28 DIAGNOSIS — Z23 Encounter for immunization: Secondary | ICD-10-CM

## 2013-01-28 DIAGNOSIS — E785 Hyperlipidemia, unspecified: Secondary | ICD-10-CM

## 2013-01-28 DIAGNOSIS — I10 Essential (primary) hypertension: Secondary | ICD-10-CM

## 2013-01-28 DIAGNOSIS — E119 Type 2 diabetes mellitus without complications: Secondary | ICD-10-CM

## 2013-01-28 NOTE — Patient Instructions (Signed)
Limit your sodium (Salt) intake  You need to lose weight.  Consider a lower calorie diet and regular exercise.    It is important that you exercise regularly, at least 20 minutes 3 to 4 times per week.  If you develop chest pain or shortness of breath seek  medical attention.   

## 2013-01-28 NOTE — Progress Notes (Signed)
Subjective:    Patient ID: Kenneth [name], male    DOB: 08-06-1955, 57 y.o.   MRN: [id number]  HPI  57 year old patient who is seen today for his coronary followup. He has type 2 diabetes which has been stable. He will A1c is over the past 6 months have been well controlled. He has hypertension exogenous obesity and dyslipidemia. No cardiopulmonary complaints. He has had a recent eye examination.  Past Medical History  Diagnosis Date  . Hypertension   . Cancer     prostate- robotic surgery, 2010.  Marland Kitchen Arthritis     ?knees,hips   . Hypercholesteremia   . Hemorrhoids     h/o  . Diabetes mellitus     diagnosed 2008    History   Social History  . Marital Status: Married    Spouse Name: N/A    Number of Children: N/A  . Years of Education: N/A   Occupational History  . Not on file.   Social History Main Topics  . Smoking status: Never Smoker   . Smokeless tobacco: Never Used  . Alcohol Use: 4.2 oz/week    7 Glasses of wine per week  . Drug Use: No  . Sexual Activity: No   Other Topics Concern  . Not on file   Social History Narrative  . No narrative on file    Past Surgical History  Procedure Laterality Date  . Prostatectomy  October 15th, 2010  . Abdominal hernia repair  02/22/11  . Ventral hernia repair  02/22/2011    Procedure: LAPAROSCOPIC VENTRAL HERNIA;  Surgeon: Robyne Askew, MD;  Location: [facility];  Service: General;  Laterality: N/A;  laparoscopic ventral hernia repair with mesh  . Hernia repair  02/22/11    vental hernia    Family History  Problem Relation Age of Onset  . Hypertension Mother   . Diabetes Father   . Hypertension Father   . Anesthesia problems Neg Hx   . Hypotension Neg Hx   . Malignant hyperthermia Neg Hx   . Pseudochol deficiency Neg Hx     No Known Allergies  Current Outpatient Prescriptions on File Prior to Visit  Medication Sig Dispense Refill  . amLODipine (NORVASC) 10 MG tablet Take 1 tablet (10 mg total) by mouth  daily.  90 tablet  3  . atorvastatin (LIPITOR) 40 MG tablet Take 1 tablet (40 mg total) by mouth daily.  90 tablet  3  . benazepril-hydrochlorthiazide (LOTENSIN HCT) 20-25 MG per tablet Take 1 tablet by mouth daily.  90 tablet  3  . cetirizine (ZYRTEC) 10 MG tablet Take 10 mg by mouth daily.      . fluticasone (FLONASE) 50 MCG/ACT nasal spray Place 2 sprays into the nose daily.  16 g  6  . metFORMIN (GLUCOPHAGE) 1000 MG tablet Take 1 tablet (1,000 mg total) by mouth 2 (two) times daily with a meal.  180 tablet  3  . OVER THE COUNTER MEDICATION Take 1 tablet by mouth daily. Mega Red Joint      . potassium chloride SA (K-DUR,KLOR-CON) 20 MEQ tablet Take 1 tablet (20 mEq total) by mouth daily.  90 tablet  4  . tadalafil (CIALIS) 5 MG tablet TAKE 1 TABLET EVERY 3 DAYS  10 tablet  3   No current facility-administered medications on file prior to visit.    BP 124/80  Pulse 102  Temp(Src) 98 F (36.7 C) (Oral)  Resp 20  Wt 231 lb (104.781 kg)  SpO2 98%     Wt Readings from Last 3 Encounters:  01/28/13 231 lb (104.781 kg)  10/28/12 228 lb (103.42 kg)  06/27/12 223 lb (101.152 kg)    Review of Systems  Constitutional: Negative for fever, chills, appetite change and fatigue.  HENT: Negative for congestion, dental problem, ear pain, hearing loss, sore throat, tinnitus, trouble swallowing and voice change.   Eyes: Negative for pain, discharge and visual disturbance.  Respiratory: Negative for cough, chest tightness, wheezing and stridor.   Cardiovascular: Negative for chest pain, palpitations and leg swelling.  Gastrointestinal: Negative for nausea, vomiting, abdominal pain, diarrhea, constipation, blood in stool and abdominal distention.  Genitourinary: Negative for urgency, hematuria, flank pain, discharge, difficulty urinating and genital sores.  Musculoskeletal: Negative for arthralgias, back pain, gait problem, joint swelling, myalgias and neck stiffness.  Skin: Negative for rash.   Neurological: Negative for dizziness, syncope, speech difficulty, weakness, numbness and headaches.  Hematological: Negative for adenopathy. Does not bruise/bleed easily.  Psychiatric/Behavioral: Negative for behavioral problems and dysphoric mood. The patient is not nervous/anxious.        Objective:   Physical Exam  Constitutional: He is oriented to person, place, and time. He appears well-developed.  HENT:  Head: Normocephalic.  Right Ear: External ear normal.  Left Ear: External ear normal.  Eyes: Conjunctivae and EOM are normal.  Neck: Normal range of motion.  Cardiovascular: Normal rate and normal heart sounds.   Pulmonary/Chest: Breath sounds normal.  Abdominal: Bowel sounds are normal.  Musculoskeletal: Normal range of motion. He exhibits no edema and no tenderness.  Neurological: He is alert and oriented to person, place, and time.  Psychiatric: He has a normal mood and affect. His behavior is normal.          Assessment & Plan:    DM2 HTN HLD  No change in therapy ROV 3 months

## 2013-02-11 ENCOUNTER — Other Ambulatory Visit: Payer: Self-pay | Admitting: Internal Medicine

## 2013-02-16 ENCOUNTER — Other Ambulatory Visit: Payer: Self-pay | Admitting: Internal Medicine

## 2013-04-29 ENCOUNTER — Ambulatory Visit: Payer: BC Managed Care – PPO | Admitting: Internal Medicine

## 2013-05-06 ENCOUNTER — Ambulatory Visit: Payer: BC Managed Care – PPO | Admitting: Internal Medicine

## 2013-05-09 ENCOUNTER — Encounter: Payer: Self-pay | Admitting: Internal Medicine

## 2013-05-09 ENCOUNTER — Other Ambulatory Visit: Payer: Self-pay | Admitting: *Deleted

## 2013-05-09 ENCOUNTER — Ambulatory Visit (INDEPENDENT_AMBULATORY_CARE_PROVIDER_SITE_OTHER): Payer: BC Managed Care – PPO | Admitting: Internal Medicine

## 2013-05-09 VITALS — BP 120/84 | Temp 98.2°F | Wt 232.0 lb

## 2013-05-09 DIAGNOSIS — I10 Essential (primary) hypertension: Secondary | ICD-10-CM

## 2013-05-09 DIAGNOSIS — E119 Type 2 diabetes mellitus without complications: Secondary | ICD-10-CM

## 2013-05-09 NOTE — Progress Notes (Signed)
Subjective:    Patient ID: Kenneth [name], male    DOB: Mar 06, 1956, 58 y.o.   MRN: [id number]  HPI Wt Readings from Last 3 Encounters:  05/09/13 232 lb (105.235 kg)  01/28/13 231 lb (104.781 kg)  10/28/12 228 lb (103.59 kg)   58 year old patient who is seen today for followup of type 2 diabetes, hypertension, and dyslipidemia.  He is doing well.  No cardiopulmonary complaints.  No change in his weight of 232. He has maintained reasonable glycemic control.  He has a history of gout, which has been stable.  Past Medical History  Diagnosis Date  . Hypertension   . Cancer     prostate- robotic surgery, 2010.  Marland Kitchen Arthritis     ?knees,hips   . Hypercholesteremia   . Hemorrhoids     h/o  . Diabetes mellitus     diagnosed 2008    History   Social History  . Marital Status: Married    Spouse Name: N/A    Number of Children: N/A  . Years of Education: N/A   Occupational History  . Not on file.   Social History Main Topics  . Smoking status: Never Smoker   . Smokeless tobacco: Never Used  . Alcohol Use: 4.2 oz/week    7 Glasses of wine per week  . Drug Use: No  . Sexual Activity: No   Other Topics Concern  . Not on file   Social History Narrative  . No narrative on file    Past Surgical History  Procedure Laterality Date  . Prostatectomy  October 15th, 2010  . Abdominal hernia repair  02/22/11  . Ventral hernia repair  02/22/2011    Procedure: LAPAROSCOPIC VENTRAL HERNIA;  Surgeon: Merrie Roof, MD;  Location: [facility];  Service: General;  Laterality: N/A;  laparoscopic ventral hernia repair with mesh  . Hernia repair  02/22/11    vental hernia    Family History  Problem Relation Age of Onset  . Hypertension Mother   . Diabetes Father   . Hypertension Father   . Anesthesia problems Neg Hx   . Hypotension Neg Hx   . Malignant hyperthermia Neg Hx   . Pseudochol deficiency Neg Hx     No Known Allergies  Current Outpatient Prescriptions on File Prior to  Visit  Medication Sig Dispense Refill  . amLODipine (NORVASC) 10 MG tablet Take 1 tablet (10 mg total) by mouth daily.  90 tablet  3  . atorvastatin (LIPITOR) 40 MG tablet Take 1 tablet (40 mg total) by mouth daily.  90 tablet  3  . benazepril-hydrochlorthiazide (LOTENSIN HCT) 20-25 MG per tablet Take 1 tablet by mouth daily.  90 tablet  3  . cetirizine (ZYRTEC) 10 MG tablet Take 10 mg by mouth daily.      Marland Kitchen CIALIS 5 MG tablet TAKE 1 TABLET EVERY 3 DAYS  10 tablet  2  . fluticasone (FLONASE) 50 MCG/ACT nasal spray Place 2 sprays into the nose daily.  16 g  6  . metFORMIN (GLUCOPHAGE) 1000 MG tablet TAKE 1 TABLET TWICE A DAY WITH A MEAL  180 tablet  1  . OVER THE COUNTER MEDICATION Take 1 tablet by mouth daily. Mega Red Joint      . potassium chloride SA (K-DUR,KLOR-CON) 20 MEQ tablet Take 1 tablet (20 mEq total) by mouth daily.  90 tablet  4   No current facility-administered medications on file prior to visit.    BP 120/84  Temp(Src) 98.2 F (36.8 C) (Oral)  Wt 232 lb (105.235 kg)     Review of Systems  Constitutional: Negative for fever, chills, appetite change and fatigue.  HENT: Negative for congestion, dental problem, ear pain, hearing loss, sore throat, tinnitus, trouble swallowing and voice change.   Eyes: Negative for pain, discharge and visual disturbance.  Respiratory: Negative for cough, chest tightness, wheezing and stridor.   Cardiovascular: Negative for chest pain, palpitations and leg swelling.  Gastrointestinal: Negative for nausea, vomiting, abdominal pain, diarrhea, constipation, blood in stool and abdominal distention.  Genitourinary: Negative for urgency, hematuria, flank pain, discharge, difficulty urinating and genital sores.  Musculoskeletal: Negative for arthralgias, back pain, gait problem, joint swelling, myalgias and neck stiffness.  Skin: Negative for rash.  Neurological: Negative for dizziness, syncope, speech difficulty, weakness, numbness and headaches.   Hematological: Negative for adenopathy. Does not bruise/bleed easily.  Psychiatric/Behavioral: Negative for behavioral problems and dysphoric mood. The patient is not nervous/anxious.        Objective:   Physical Exam  Constitutional: He is oriented to person, place, and time. He appears well-developed.  HENT:  Head: Normocephalic.  Right Ear: External ear normal.  Left Ear: External ear normal.  Eyes: Conjunctivae and EOM are normal.  Neck: Normal range of motion.  Cardiovascular: Normal rate and normal heart sounds.   Pulmonary/Chest: Breath sounds normal.  Abdominal: Bowel sounds are normal.  Musculoskeletal: Normal range of motion. He exhibits no edema and no tenderness.  Neurological: He is alert and oriented to person, place, and time.  Psychiatric: He has a normal mood and affect. His behavior is normal.          Assessment & Plan:  Diabetes mellitus.  We'll check a hemoglobin A1c.  Lifestyle issues addressed Hypertension well controlled Dyslipidemia.  Continue atorvastatin.  History of gout, stable and asymptomatic. No change in regimen Recheck 6 months Diet weight loss, exercise, are encouraged

## 2013-05-09 NOTE — Progress Notes (Signed)
Pre visit review using our clinic review tool, if applicable. No additional management support is needed unless otherwise documented below in the visit note. 

## 2013-05-09 NOTE — Patient Instructions (Signed)
Limit your sodium (Salt) intake    It is important that you exercise regularly, at least 20 minutes 3 to 4 times per week.  If you develop chest pain or shortness of breath seek  medical attention.  You need to lose weight.  Consider a lower calorie diet and regular exercise.  Return in 6 months for follow-up   

## 2013-05-10 LAB — HEMOGLOBIN A1C
HEMOGLOBIN A1C: 6.8 % — AB (ref <5.7)
MEAN PLASMA GLUCOSE: 148 mg/dL — AB (ref <117)

## 2013-05-12 ENCOUNTER — Telehealth: Payer: Self-pay | Admitting: Internal Medicine

## 2013-05-12 NOTE — Telephone Encounter (Signed)
Relevant patient education assigned to patient using Emmi. ° °

## 2013-05-13 ENCOUNTER — Telehealth: Payer: Self-pay

## 2013-05-13 NOTE — Telephone Encounter (Signed)
Relevant patient education assigned to patient using Emmi. ° °

## 2013-06-12 ENCOUNTER — Other Ambulatory Visit: Payer: Self-pay | Admitting: Internal Medicine

## 2013-06-20 ENCOUNTER — Telehealth: Payer: Self-pay | Admitting: Internal Medicine

## 2013-06-20 MED ORDER — FLUTICASONE PROPIONATE 50 MCG/ACT NA SUSP: 2.0000 | Freq: Every day | NASAL | Status: DC

## 2013-06-20 MED ORDER — TADALAFIL 5 MG PO TABS: ORAL_TABLET | ORAL | Status: DC

## 2013-06-20 NOTE — Telephone Encounter (Signed)
Pt is needing new rx CIALIS 5 MG tablet and fluticasone (flonase) 50 mcg/act nasal spray, sent [facility].

## 2013-06-20 NOTE — Telephone Encounter (Signed)
Pt notified Rx's sent to [facility]. 

## 2013-08-29 ENCOUNTER — Other Ambulatory Visit: Payer: Self-pay | Admitting: Internal Medicine

## 2013-09-03 ENCOUNTER — Other Ambulatory Visit: Payer: Self-pay | Admitting: Internal Medicine

## 2013-10-06 ENCOUNTER — Other Ambulatory Visit: Payer: Self-pay | Admitting: Internal Medicine

## 2013-10-09 ENCOUNTER — Telehealth: Payer: Self-pay | Admitting: *Deleted

## 2013-10-09 DIAGNOSIS — E785 Hyperlipidemia, unspecified: Secondary | ICD-10-CM

## 2013-10-09 DIAGNOSIS — E119 Type 2 diabetes mellitus without complications: Secondary | ICD-10-CM

## 2013-10-09 NOTE — Telephone Encounter (Signed)
Patient scheduled an appointment to come in for his yearly physical Lipid ordered Diabetic bundle

## 2013-10-20 ENCOUNTER — Other Ambulatory Visit: Payer: Self-pay | Admitting: Internal Medicine

## 2013-10-23 ENCOUNTER — Telehealth: Payer: Self-pay | Admitting: *Deleted

## 2013-10-23 NOTE — Telephone Encounter (Signed)
Pt needs lipid, bmet, hgba1c, micro-alb.  Left message for pt to call back

## 2013-10-31 NOTE — Telephone Encounter (Signed)
Left message for pt to call back  °

## 2013-11-05 ENCOUNTER — Encounter: Payer: Self-pay | Admitting: *Deleted

## 2013-11-05 NOTE — Telephone Encounter (Signed)
Mailed letter for pt to contact us to schedule labs and follow up appt with Dr Raliegh Ip

## 2013-11-06 ENCOUNTER — Ambulatory Visit: Payer: BC Managed Care – PPO | Admitting: Internal Medicine

## 2013-11-13 ENCOUNTER — Ambulatory Visit (INDEPENDENT_AMBULATORY_CARE_PROVIDER_SITE_OTHER): Payer: BC Managed Care – PPO | Admitting: Internal Medicine

## 2013-11-13 ENCOUNTER — Encounter: Payer: Self-pay | Admitting: Internal Medicine

## 2013-11-13 VITALS — BP 110/80 | HR 84 | Temp 98.0°F | Resp 20 | Ht 66.0 in | Wt 240.0 lb

## 2013-11-13 DIAGNOSIS — E119 Type 2 diabetes mellitus without complications: Secondary | ICD-10-CM

## 2013-11-13 DIAGNOSIS — I10 Essential (primary) hypertension: Secondary | ICD-10-CM

## 2013-11-13 DIAGNOSIS — E785 Hyperlipidemia, unspecified: Secondary | ICD-10-CM

## 2013-11-13 LAB — LIPID PANEL
CHOLESTEROL: 146 mg/dL (ref 0–200)
HDL: 31.6 mg/dL — ABNORMAL LOW (ref >39.00)
LDL Cholesterol: 96 mg/dL (ref 0–99)
NonHDL: 114.4
TRIGLYCERIDES: 90 mg/dL (ref 0.0–149.0)
Total CHOL/HDL Ratio: 5
VLDL: 18 mg/dL (ref 0.0–40.0)

## 2013-11-13 LAB — HEMOGLOBIN A1C: Hgb A1c MFr Bld: 7.3 % — ABNORMAL HIGH (ref 4.6–6.5)

## 2013-11-13 MED ORDER — TADALAFIL 5 MG PO TABS: ORAL_TABLET | ORAL | Status: DC

## 2013-11-13 NOTE — Patient Instructions (Signed)
It is important that you exercise regularly, at least 20 minutes 3 to 4 times per week.  If you develop chest pain or shortness of breath seek  medical attention.  You need to lose weight.  Consider a lower calorie diet and regular exercise.  Limit your sodium (Salt) intake

## 2013-11-13 NOTE — Progress Notes (Signed)
Pre visit review using our clinic review tool, if applicable. No additional management support is needed unless otherwise documented below in the visit note. 

## 2013-11-13 NOTE — Progress Notes (Signed)
Subjective:    Patient ID: Kenneth [name], male    DOB: [birth year], 58 y.o.   MRN: [id number]  HPI 58 year old patient who is in today for followup of type 2 diabetes.  He has maintained on his glycemic control on metformin therapy alone.  He has hypertension, as well as dyslipidemia.  He continues to do well without concerns or complaints. There's been some significant weight gain over the past year Eye  Examination in March of this year Denies any cardiopulmonary complaints  Past Medical History  Diagnosis Date  . Hypertension   . Cancer     prostate- robotic surgery, 2010.  Marland Kitchen Arthritis     ?knees,hips   . Hypercholesteremia   . Hemorrhoids     h/o  . Diabetes mellitus     diagnosed 2008    History   Social History  . Marital Status: Married    Spouse Name: N/A    Number of Children: N/A  . Years of Education: N/A   Occupational History  . Not on file.   Social History Main Topics  . Smoking status: Never Smoker   . Smokeless tobacco: Never Used  . Alcohol Use: 4.2 oz/week    7 Glasses of wine per week  . Drug Use: No  . Sexual Activity: No   Other Topics Concern  . Not on file   Social History Narrative  . No narrative on file    Past Surgical History  Procedure Laterality Date  . Prostatectomy  October 15th, 2010  . Abdominal hernia repair  02/22/11  . Ventral hernia repair  02/22/2011    Procedure: LAPAROSCOPIC VENTRAL HERNIA;  Surgeon: Merrie Roof, MD;  Location: [facility];  Service: General;  Laterality: N/A;  laparoscopic ventral hernia repair with mesh  . Hernia repair  02/22/11    vental hernia    Family History  Problem Relation Age of Onset  . Hypertension Mother   . Diabetes Father   . Hypertension Father   . Anesthesia problems Neg Hx   . Hypotension Neg Hx   . Malignant hyperthermia Neg Hx   . Pseudochol deficiency Neg Hx     No Known Allergies  Current Outpatient Prescriptions on File Prior to Visit  Medication Sig Dispense  Refill  . amLODipine (NORVASC) 10 MG tablet TAKE 1 TABLET DAILY  90 tablet  1  . atorvastatin (LIPITOR) 40 MG tablet TAKE 1 TABLET DAILY  90 tablet  1  . benazepril-hydrochlorthiazide (LOTENSIN HCT) 20-25 MG per tablet TAKE 1 TABLET DAILY  90 tablet  1  . cetirizine (ZYRTEC) 10 MG tablet Take 10 mg by mouth daily.      Marland Kitchen COLCRYS 0.6 MG tablet Take 0.6 mg by mouth as needed.       . fluticasone (FLONASE) 50 MCG/ACT nasal spray Place 2 sprays into both nostrils daily.  48 g  1  . KLOR-CON M20 20 MEQ tablet TAKE 1 TABLET DAILY  90 tablet  3  . metFORMIN (GLUCOPHAGE) 1000 MG tablet TAKE 1 TABLET TWICE A DAY WITH MEALS  180 tablet  1  . OVER THE COUNTER MEDICATION Take 1 tablet by mouth daily. Mega Red Joint      . tadalafil (CIALIS) 5 MG tablet TAKE 1 TABLET EVERY 3 DAYS  30 tablet  1   No current facility-administered medications on file prior to visit.    BP 110/80  Pulse 84  Temp(Src) 98 F (36.7 C) (Oral)  Resp  20  Ht 5\' 6"  (1.676 m)  Wt 240 lb (108.863 kg)  BMI 38.76 kg/m2  SpO2 96%    Review of Systems  Constitutional: Positive for unexpected weight change. Negative for fever, chills, appetite change and fatigue.  HENT: Negative for congestion, dental problem, ear pain, hearing loss, sore throat, tinnitus, trouble swallowing and voice change.   Eyes: Negative for pain, discharge and visual disturbance.  Respiratory: Negative for cough, chest tightness, wheezing and stridor.   Cardiovascular: Negative for chest pain, palpitations and leg swelling.  Gastrointestinal: Negative for nausea, vomiting, abdominal pain, diarrhea, constipation, blood in stool and abdominal distention.  Genitourinary: Negative for urgency, hematuria, flank pain, discharge, difficulty urinating and genital sores.  Musculoskeletal: Negative for arthralgias, back pain, gait problem, joint swelling, myalgias and neck stiffness.  Skin: Negative for rash.  Neurological: Negative for dizziness, syncope, speech  difficulty, weakness, numbness and headaches.  Hematological: Negative for adenopathy. Does not bruise/bleed easily.  Psychiatric/Behavioral: Negative for behavioral problems and dysphoric mood. The patient is not nervous/anxious.        Objective:   Physical Exam  Constitutional: He is oriented to person, place, and time. He appears well-developed.  Repeat blood pressure 120/80  HENT:  Head: Normocephalic.  Right Ear: External ear normal.  Left Ear: External ear normal.  Eyes: Conjunctivae and EOM are normal.  Neck: Normal range of motion.  Cardiovascular: Normal rate and normal heart sounds.   Pulmonary/Chest: Breath sounds normal.  Abdominal: Bowel sounds are normal.  Musculoskeletal: Normal range of motion. He exhibits no edema and no tenderness.  Neurological: He is alert and oriented to person, place, and time.  Psychiatric: He has a normal mood and affect. His behavior is normal.          Assessment & Plan:   Diabetes mellitus.  We'll check a hemoglobin A1c Dyslipidemia.  We'll check a lipid profile Hypertension excellent control  Weight loss encouraged Schedule CPX

## 2013-11-16 ENCOUNTER — Other Ambulatory Visit: Payer: Self-pay | Admitting: Internal Medicine

## 2013-12-24 ENCOUNTER — Other Ambulatory Visit: Payer: Self-pay | Admitting: Internal Medicine

## 2013-12-29 LAB — HM DIABETES EYE EXAM

## 2014-01-06 ENCOUNTER — Encounter: Payer: Self-pay | Admitting: Internal Medicine

## 2014-02-25 ENCOUNTER — Other Ambulatory Visit: Payer: Self-pay | Admitting: Internal Medicine

## 2014-03-12 ENCOUNTER — Other Ambulatory Visit: Payer: Self-pay | Admitting: Internal Medicine

## 2014-03-15 ENCOUNTER — Other Ambulatory Visit: Payer: Self-pay | Admitting: Internal Medicine

## 2014-04-24 ENCOUNTER — Other Ambulatory Visit: Payer: Self-pay | Admitting: Internal Medicine

## 2014-05-07 ENCOUNTER — Other Ambulatory Visit: Payer: Self-pay | Admitting: Internal Medicine

## 2014-05-07 ENCOUNTER — Other Ambulatory Visit (INDEPENDENT_AMBULATORY_CARE_PROVIDER_SITE_OTHER): Payer: Self-pay

## 2014-05-07 DIAGNOSIS — Z0000 Encounter for general adult medical examination without abnormal findings: Secondary | ICD-10-CM

## 2014-05-07 LAB — POCT URINALYSIS DIPSTICK
BILIRUBIN UA: NEGATIVE
Blood, UA: NEGATIVE
Glucose, UA: NEGATIVE
KETONES UA: NEGATIVE
Leukocytes, UA: NEGATIVE
Nitrite, UA: NEGATIVE
PROTEIN UA: NEGATIVE
SPEC GRAV UA: 1.01
Urobilinogen, UA: 0.2
pH, UA: 5

## 2014-05-07 LAB — HEPATIC FUNCTION PANEL
ALT: 21 U/L (ref 0–53)
AST: 30 U/L (ref 0–37)
Albumin: 4.6 g/dL (ref 3.5–5.2)
Alkaline Phosphatase: 53 U/L (ref 39–117)
BILIRUBIN DIRECT: 0.1 mg/dL (ref 0.0–0.3)
BILIRUBIN TOTAL: 0.5 mg/dL (ref 0.2–1.2)
Total Protein: 7.7 g/dL (ref 6.0–8.3)

## 2014-05-07 LAB — MICROALBUMIN / CREATININE URINE RATIO
Creatinine,U: 182.1 mg/dL
Microalb Creat Ratio: 0.5 mg/g (ref 0.0–30.0)
Microalb, Ur: 0.9 mg/dL (ref 0.0–1.9)

## 2014-05-07 LAB — PSA: PSA: 0.03 ng/mL — AB (ref 0.10–4.00)

## 2014-05-07 LAB — CBC WITH DIFFERENTIAL/PLATELET
BASOS PCT: 0.8 % (ref 0.0–3.0)
Basophils Absolute: 0 10*3/uL (ref 0.0–0.1)
EOS ABS: 0.3 10*3/uL (ref 0.0–0.7)
Eosinophils Relative: 5.8 % — ABNORMAL HIGH (ref 0.0–5.0)
HCT: 42.2 % (ref 39.0–52.0)
Hemoglobin: 14.1 g/dL (ref 13.0–17.0)
LYMPHS PCT: 45.3 % (ref 12.0–46.0)
Lymphs Abs: 2.2 10*3/uL (ref 0.7–4.0)
MCHC: 33.5 g/dL (ref 30.0–36.0)
MCV: 82.8 fl (ref 78.0–100.0)
MONO ABS: 0.5 10*3/uL (ref 0.1–1.0)
Monocytes Relative: 10.9 % (ref 3.0–12.0)
NEUTROS PCT: 37.2 % — AB (ref 43.0–77.0)
Neutro Abs: 1.8 10*3/uL (ref 1.4–7.7)
PLATELETS: 261 10*3/uL (ref 150.0–400.0)
RBC: 5.09 Mil/uL (ref 4.22–5.81)
RDW: 14.6 % (ref 11.5–15.5)
WBC: 4.8 10*3/uL (ref 4.0–10.5)

## 2014-05-07 LAB — BASIC METABOLIC PANEL
BUN: 15 mg/dL (ref 6–23)
CALCIUM: 9.9 mg/dL (ref 8.4–10.5)
CO2: 31 meq/L (ref 19–32)
CREATININE: 1.01 mg/dL (ref 0.40–1.50)
Chloride: 100 mEq/L (ref 96–112)
GFR: 97.63 mL/min (ref >60.00)
GLUCOSE: 103 mg/dL — AB (ref 70–99)
Potassium: 4.6 mEq/L (ref 3.5–5.1)
Sodium: 138 mEq/L (ref 135–145)

## 2014-05-07 LAB — TSH: TSH: 1.08 u[IU]/mL (ref 0.35–4.50)

## 2014-05-07 LAB — HEMOGLOBIN A1C: Hgb A1c MFr Bld: 7.1 % — ABNORMAL HIGH (ref 4.6–6.5)

## 2014-05-14 ENCOUNTER — Ambulatory Visit (INDEPENDENT_AMBULATORY_CARE_PROVIDER_SITE_OTHER): Payer: BLUE CROSS/BLUE SHIELD | Admitting: Internal Medicine

## 2014-05-14 ENCOUNTER — Encounter: Payer: Self-pay | Admitting: Internal Medicine

## 2014-05-14 VITALS — BP 110/70 | HR 80 | Temp 98.0°F | Resp 20 | Ht 66.25 in | Wt 226.0 lb

## 2014-05-14 DIAGNOSIS — Z0000 Encounter for general adult medical examination without abnormal findings: Secondary | ICD-10-CM

## 2014-05-14 DIAGNOSIS — E785 Hyperlipidemia, unspecified: Secondary | ICD-10-CM

## 2014-05-14 DIAGNOSIS — E119 Type 2 diabetes mellitus without complications: Secondary | ICD-10-CM

## 2014-05-14 MED ORDER — COLCHICINE 0.6 MG PO TABS: 0.6000 mg | ORAL_TABLET | ORAL | Status: DC | PRN

## 2014-05-14 MED ORDER — ATORVASTATIN CALCIUM 40 MG PO TABS: 40.0000 mg | ORAL_TABLET | Freq: Every day | ORAL | Status: DC

## 2014-05-14 MED ORDER — TADALAFIL 5 MG PO TABS: ORAL_TABLET | ORAL | Status: DC

## 2014-05-14 MED ORDER — FLUTICASONE PROPIONATE 50 MCG/ACT NA SUSP: 2.0000 | Freq: Every day | NASAL | Status: DC

## 2014-05-14 NOTE — Progress Notes (Signed)
   Subjective:    Patient ID: Kenneth [name], male    DOB: 09/24/1955, 59 y.o.   MRN: [id number]  HPI  Wt Readings from Last 3 Encounters:  05/14/14 226 lb (102.513 kg)  11/13/13 240 lb (108.863 kg)  05/09/13 232 lb (105.235 kg)    Review of Systems     Objective:   Physical Exam        Assessment & Plan:

## 2014-05-14 NOTE — Progress Notes (Signed)
Subjective:    Patient ID: Kenneth [name], male    DOB: Jul 31, 1955, 59 y.o.   MRN: [id number]  HPI 59 year-old patient who is seen today for a preventive health examination.  Since his last visit here 6 months ago, he has had a nice weight reduction.  Hemoglobin A1c 7.1 He has history of dyslipidemia controlled on statin therapy. He has treated hypertension which has been stable there is a remote history of gout. Done quite well today. No concerns or complaints.  Not as active at the gym due to a history of low back pain. He did have an eye examination in December of last year and is scheduled to see urology later this year.  PSA remains slightly detectable  Past Medical History  Diagnosis Date  . Hypertension   . Cancer     prostate- robotic surgery, 2010.  Marland Kitchen Arthritis     ?knees,hips   . Hypercholesteremia   . Hemorrhoids     h/o  . Diabetes mellitus     diagnosed 2008    History   Social History  . Marital Status: Married    Spouse Name: N/A  . Number of Children: N/A  . Years of Education: N/A   Occupational History  . Not on file.   Social History Main Topics  . Smoking status: Never Smoker   . Smokeless tobacco: Never Used  . Alcohol Use: 4.2 oz/week    7 Glasses of wine per week  . Drug Use: No  . Sexual Activity: No   Other Topics Concern  . Not on file   Social History Narrative    Past Surgical History  Procedure Laterality Date  . Prostatectomy  October 15th, 2010  . Abdominal hernia repair  02/22/11  . Ventral hernia repair  02/22/2011    Procedure: LAPAROSCOPIC VENTRAL HERNIA;  Surgeon: Merrie Roof, MD;  Location: [facility];  Service: General;  Laterality: N/A;  laparoscopic ventral hernia repair with mesh  . Hernia repair  02/22/11    vental hernia    Family History  Problem Relation Age of Onset  . Hypertension Mother   . Diabetes Father   . Hypertension Father   . Anesthesia problems Neg Hx   . Hypotension Neg Hx   . Malignant  hyperthermia Neg Hx   . Pseudochol deficiency Neg Hx     No Known Allergies  Current Outpatient Prescriptions on File Prior to Visit  Medication Sig Dispense Refill  . amLODipine (NORVASC) 10 MG tablet TAKE 1 TABLET DAILY 90 tablet 1  . benazepril-hydrochlorthiazide (LOTENSIN HCT) 20-25 MG per tablet TAKE 1 TABLET DAILY 90 tablet 1  . cetirizine (ZYRTEC) 10 MG tablet Take 10 mg by mouth daily.    Marland Kitchen KLOR-CON M20 20 MEQ tablet TAKE 1 TABLET DAILY 90 tablet 3  . metFORMIN (GLUCOPHAGE) 1000 MG tablet TAKE 1 TABLET TWICE A DAY WITH MEALS 180 tablet 1  . OVER THE COUNTER MEDICATION Take 1 tablet by mouth daily. Mega Red Joint     No current facility-administered medications on file prior to visit.    BP 110/70 mmHg  Pulse 80  Temp(Src) 98 F (36.7 C) (Oral)  Resp 20  Ht 5' 6.25" (1.683 m)  Wt 226 lb (102.513 kg)  BMI 36.19 kg/m2  SpO2 98%     Wt Readings from Last 3 Encounters:  05/14/14 226 lb (102.513 kg)  11/13/13 240 lb (108.863 kg)  05/09/13 232 lb (105.235 kg)  Review of Systems  Constitutional: Negative for fever, chills, activity change, appetite change and fatigue.  HENT: Negative for congestion, dental problem, ear pain, hearing loss, mouth sores, rhinorrhea, sinus pressure, sneezing, tinnitus, trouble swallowing and voice change.   Eyes: Negative for photophobia, pain, redness and visual disturbance.  Respiratory: Negative for apnea, cough, choking, chest tightness, shortness of breath and wheezing.   Cardiovascular: Negative for chest pain, palpitations and leg swelling.  Gastrointestinal: Negative for nausea, vomiting, abdominal pain, diarrhea, constipation, blood in stool, abdominal distention, anal bleeding and rectal pain.  Genitourinary: Negative for dysuria, urgency, frequency, hematuria, flank pain, decreased urine volume, discharge, penile swelling, scrotal swelling, difficulty urinating, genital sores and testicular pain.  Musculoskeletal: Negative for  myalgias, back pain, joint swelling, arthralgias, gait problem, neck pain and neck stiffness.  Skin: Negative for color change, rash and wound.  Neurological: Negative for dizziness, tremors, seizures, syncope, facial asymmetry, speech difficulty, weakness, light-headedness, numbness and headaches.  Hematological: Negative for adenopathy. Does not bruise/bleed easily.  Psychiatric/Behavioral: Negative for suicidal ideas, hallucinations, behavioral problems, confusion, sleep disturbance, self-injury, dysphoric mood, decreased concentration and agitation. The patient is not nervous/anxious.        Objective:   Physical Exam  Constitutional: He appears well-developed and well-nourished.  HENT:  Head: Normocephalic and atraumatic.  Right Ear: External ear normal.  Left Ear: External ear normal.  Nose: Nose normal.  Mouth/Throat: Oropharynx is clear and moist.  Eyes: Conjunctivae and EOM are normal. Pupils are equal, round, and reactive to light. No scleral icterus.  Neck: Normal range of motion. Neck supple. No JVD present. No thyromegaly present.  Cardiovascular: Regular rhythm, normal heart sounds and intact distal pulses.  Exam reveals no gallop and no friction rub.   No murmur heard. Pulmonary/Chest: Effort normal and breath sounds normal. He exhibits no tenderness.  Abdominal: Soft. Bowel sounds are normal. He exhibits no distension and no mass. There is no tenderness.  Genitourinary: Penis normal.  Right testicular atrophy  Musculoskeletal: Normal range of motion. He exhibits no edema or tenderness.  Lymphadenopathy:    He has no cervical adenopathy.  Neurological: He is alert. He has normal reflexes. No cranial nerve deficit. Coordination normal.  Skin: Skin is warm and dry. No rash noted.  Psychiatric: He has a normal mood and affect. His behavior is normal.          Assessment & Plan:   Preventive health examination Diabetes mellitus. Hemoglobin A1c 7.1 Hypertension well  controlled. Blood pressure 110 over 70 Obesity. Improved  Continue present regimen Recheck 6 months

## 2014-05-14 NOTE — Progress Notes (Signed)
Pre visit review using our clinic review tool, if applicable. No additional management support is needed unless otherwise documented below in the visit note. 

## 2014-05-14 NOTE — Patient Instructions (Signed)
Limit your sodium (Salt) intake   Please check your hemoglobin A1c every 3 months    It is important that you exercise regularly, at least 20 minutes 3 to 4 times per week.  If you develop chest pain or shortness of breath seek  medical attention.  You need to lose weight.  Consider a lower calorie diet and regular exercise.  Health Maintenance A healthy lifestyle and preventative care can promote health and wellness.  Maintain regular health, dental, and eye exams.  Eat a healthy diet. Foods like vegetables, fruits, whole grains, low-fat dairy products, and lean protein foods contain the nutrients you need and are low in calories. Decrease your intake of foods high in solid fats, added sugars, and salt. Get information about a proper diet from your health care provider, if necessary.  Regular physical exercise is one of the most important things you can do for your health. Most adults should get at least 150 minutes of moderate-intensity exercise (any activity that increases your heart rate and causes you to sweat) each week. In addition, most adults need muscle-strengthening exercises on 2 or more days a week.   Maintain a healthy weight. The body mass index (BMI) is a screening tool to identify possible weight problems. It provides an estimate of body fat based on height and weight. Your health care provider can find your BMI and can help you achieve or maintain a healthy weight. For males 20 years and older:  A BMI below 18.5 is considered underweight.  A BMI of 18.5 to 24.9 is normal.  A BMI of 25 to 29.9 is considered overweight.  A BMI of 30 and above is considered obese.  Maintain normal blood lipids and cholesterol by exercising and minimizing your intake of saturated fat. Eat a balanced diet with plenty of fruits and vegetables. Blood tests for lipids and cholesterol should begin at age 31 and be repeated every 5 years. If your lipid or cholesterol levels are high, you are over  age 64, or you are at high risk for heart disease, you may need your cholesterol levels checked more frequently.Ongoing high lipid and cholesterol levels should be treated with medicines if diet and exercise are not working.  If you smoke, find out from your health care provider how to quit. If you do not use tobacco, do not start.  Lung cancer screening is recommended for adults aged 19-80 years who are at high risk for developing lung cancer because of a history of smoking. A yearly low-dose CT scan of the lungs is recommended for people who have at least a 30-pack-year history of smoking and are current smokers or have quit within the past 15 years. A pack year of smoking is smoking an average of 1 pack of cigarettes a day for 1 year (for example, a 30-pack-year history of smoking could mean smoking 1 pack a day for 30 years or 2 packs a day for 15 years). Yearly screening should continue until the smoker has stopped smoking for at least 15 years. Yearly screening should be stopped for people who develop a health problem that would prevent them from having lung cancer treatment.  If you choose to drink alcohol, do not have more than 2 drinks per day. One drink is considered to be 12 oz (360 mL) of beer, 5 oz (150 mL) of wine, or 1.5 oz (45 mL) of liquor.  Avoid the use of street drugs. Do not share needles with anyone. Ask for help  if you need support or instructions about stopping the use of drugs.  High blood pressure causes heart disease and increases the risk of stroke. Blood pressure should be checked at least every 1-2 years. Ongoing high blood pressure should be treated with medicines if weight loss and exercise are not effective.  If you are 26-55 years old, ask your health care provider if you should take aspirin to prevent heart disease.  Diabetes screening involves taking a blood sample to check your fasting blood sugar level. This should be done once every 3 years after age 23 if you  are at a normal weight and without risk factors for diabetes. Testing should be considered at a younger age or be carried out more frequently if you are overweight and have at least 1 risk factor for diabetes.  Colorectal cancer can be detected and often prevented. Most routine colorectal cancer screening begins at the age of 13 and continues through age 31. However, your health care provider may recommend screening at an earlier age if you have risk factors for colon cancer. On a yearly basis, your health care provider may provide home test kits to check for hidden blood in the stool. A small camera at the end of a tube may be used to directly examine the colon (sigmoidoscopy or colonoscopy) to detect the earliest forms of colorectal cancer. Talk to your health care provider about this at age 72 when routine screening begins. A direct exam of the colon should be repeated every 5-10 years through age 46, unless early forms of precancerous polyps or small growths are found.  People who are at an increased risk for hepatitis B should be screened for this virus. You are considered at high risk for hepatitis B if:  You were born in a country where hepatitis B occurs often. Talk with your health care provider about which countries are considered high risk.  Your parents were born in a high-risk country and you have not received a shot to protect against hepatitis B (hepatitis B vaccine).  You have HIV or AIDS.  You use needles to inject street drugs.  You live with, or have sex with, someone who has hepatitis B.  You are a man who has sex with other men (MSM).  You get hemodialysis treatment.  You take certain medicines for conditions like cancer, organ transplantation, and autoimmune conditions.  Hepatitis C blood testing is recommended for all people born from [birth year] through 1964 and any individual with known risk factors for hepatitis C.  Healthy men should no longer receive prostate-specific  antigen (PSA) blood tests as part of routine cancer screening. Talk to your health care provider about prostate cancer screening.  Testicular cancer screening is not recommended for adolescents or adult males who have no symptoms. Screening includes self-exam, a health care provider exam, and other screening tests. Consult with your health care provider about any symptoms you have or any concerns you have about testicular cancer.  Practice safe sex. Use condoms and avoid high-risk sexual practices to reduce the spread of sexually transmitted infections (STIs).  You should be screened for STIs, including gonorrhea and chlamydia if:  You are sexually active and are younger than 24 years.  You are older than 24 years, and your health care provider tells you that you are at risk for this type of infection.  Your sexual activity has changed since you were last screened, and you are at an increased risk for chlamydia or gonorrhea. Ask  your health care provider if you are at risk.  If you are at risk of being infected with HIV, it is recommended that you take a prescription medicine daily to prevent HIV infection. This is called pre-exposure prophylaxis (PrEP). You are considered at risk if:  You are a man who has sex with other men (MSM).  You are a heterosexual man who is sexually active with multiple partners.  You take drugs by injection.  You are sexually active with a partner who has HIV.  Talk with your health care provider about whether you are at high risk of being infected with HIV. If you choose to begin PrEP, you should first be tested for HIV. You should then be tested every 3 months for as long as you are taking PrEP.  Use sunscreen. Apply sunscreen liberally and repeatedly throughout the day. You should seek shade when your shadow is shorter than you. Protect yourself by wearing long sleeves, pants, a wide-brimmed hat, and sunglasses year round whenever you are outdoors.  Tell  your health care provider of new moles or changes in moles, especially if there is a change in shape or color. Also, tell your health care provider if a mole is larger than the size of a pencil eraser.  A one-time screening for abdominal aortic aneurysm (AAA) and surgical repair of large AAAs by ultrasound is recommended for men aged 39-75 years who are current or former smokers.  Stay current with your vaccines (immunizations). Document Released: 08/26/2007 Document Revised: 03/04/2013 Document Reviewed: 07/25/2010 [facility] Patient Information 2015 [place], [place]. This information is not intended to replace advice given to you by your health care provider. Make sure you discuss any questions you have with your health care provider.

## 2014-05-21 ENCOUNTER — Telehealth: Payer: Self-pay | Admitting: Internal Medicine

## 2014-05-21 MED ORDER — COLCHICINE 0.6 MG PO TABS: 0.6000 mg | ORAL_TABLET | Freq: Every day | ORAL | Status: DC | PRN

## 2014-05-21 NOTE — Telephone Encounter (Signed)
Called [facility] spoke to [name] told her pt takes Colchicine as needed for gout. [name] said need to clarify daily or weekly. Told [name] can change Rx to one tablet daily as needed. [name] verbalized understanding.

## 2014-05-21 NOTE — Telephone Encounter (Signed)
Hassan Rowan from [facility] need clarification on the following med colchicine (COLCRYS) 0.6 MG tablet   Need a call back today so that they can send pt medicine out      Phone number [phone]  Ref no [id number]

## 2014-07-17 ENCOUNTER — Other Ambulatory Visit: Payer: Self-pay | Admitting: Internal Medicine

## 2014-10-14 ENCOUNTER — Encounter: Payer: Self-pay | Admitting: Internal Medicine

## 2014-10-14 ENCOUNTER — Ambulatory Visit (INDEPENDENT_AMBULATORY_CARE_PROVIDER_SITE_OTHER): Payer: BLUE CROSS/BLUE SHIELD | Admitting: Internal Medicine

## 2014-10-14 VITALS — BP 100/70 | HR 76 | Temp 98.4°F | Resp 20 | Ht 66.25 in | Wt 225.0 lb

## 2014-10-14 DIAGNOSIS — E785 Hyperlipidemia, unspecified: Secondary | ICD-10-CM

## 2014-10-14 DIAGNOSIS — I10 Essential (primary) hypertension: Secondary | ICD-10-CM

## 2014-10-14 DIAGNOSIS — E119 Type 2 diabetes mellitus without complications: Secondary | ICD-10-CM | POA: Diagnosis not present

## 2014-10-14 LAB — HEMOGLOBIN A1C: Hgb A1c MFr Bld: 6.6 % — ABNORMAL HIGH (ref 4.6–6.5)

## 2014-10-14 NOTE — Patient Instructions (Signed)
Limit your sodium (Salt) intake    It is important that you exercise regularly, at least 20 minutes 3 to 4 times per week.  If you develop chest pain or shortness of breath seek  medical attention.  You need to lose weight.  Consider a lower calorie diet and regular exercise.  Return in 6 months for follow-up   

## 2014-10-14 NOTE — Progress Notes (Signed)
Pre visit review using our clinic review tool, if applicable. No additional management support is needed unless otherwise documented below in the visit note. 

## 2014-10-14 NOTE — Progress Notes (Signed)
Subjective:    Patient ID: Kenneth [name], male    DOB: 07/15/1955, 59 y.o.   MRN: [id number]  HPI Lab Results  Component Value Date   HGBA1C 7.1* 05/07/2014    Wt Readings from Last 3 Encounters:  10/14/14 225 lb (102.059 kg)  05/14/14 226 lb (102.513 kg)  11/13/13 240 lb (108.64 kg)   59 year old patient who is seen today for his biannual follow-up.  He has a history of type 2 diabetes without complications.  Last hemoglobin A1c 7.1.  No concerns or complaints.  He has treated hypertension.  Remains on statin therapy for dyslipidemia which she continues to tolerate well.  No cardiopulmonary complaints.  Social history.  Wife is recovering from a lumbar fusion  Past Medical History  Diagnosis Date  . Hypertension   . Cancer     prostate- robotic surgery, 2010.  Marland Kitchen Arthritis     ?knees,hips   . Hypercholesteremia   . Hemorrhoids     h/o  . Diabetes mellitus     diagnosed 2008    History   Social History  . Marital Status: Married    Spouse Name: N/A  . Number of Children: N/A  . Years of Education: N/A   Occupational History  . Not on file.   Social History Main Topics  . Smoking status: Never Smoker   . Smokeless tobacco: Never Used  . Alcohol Use: 4.2 oz/week    7 Glasses of wine per week  . Drug Use: No  . Sexual Activity: No   Other Topics Concern  . Not on file   Social History Narrative    Past Surgical History  Procedure Laterality Date  . Prostatectomy  October 15th, 2010  . Abdominal hernia repair  02/22/11  . Ventral hernia repair  02/22/2011    Procedure: LAPAROSCOPIC VENTRAL HERNIA;  Surgeon: Merrie Roof, MD;  Location: [place];  Service: General;  Laterality: N/A;  laparoscopic ventral hernia repair with mesh  . Hernia repair  02/22/11    vental hernia    Family History  Problem Relation Age of Onset  . Hypertension Mother   . Diabetes Father   . Hypertension Father   . Anesthesia problems Neg Hx   . Hypotension Neg Hx   .  Malignant hyperthermia Neg Hx   . Pseudochol deficiency Neg Hx     No Known Allergies  Current Outpatient Prescriptions on File Prior to Visit  Medication Sig Dispense Refill  . amLODipine (NORVASC) 10 MG tablet TAKE 1 TABLET DAILY 90 tablet 1  . atorvastatin (LIPITOR) 40 MG tablet Take 1 tablet (40 mg total) by mouth daily. 90 tablet 1  . benazepril-hydrochlorthiazide (LOTENSIN HCT) 20-25 MG per tablet TAKE 1 TABLET DAILY 90 tablet 1  . cetirizine (ZYRTEC) 10 MG tablet Take 10 mg by mouth daily.    . fluticasone (FLONASE) 50 MCG/ACT nasal spray Place 2 sprays into both nostrils daily. 48 g 3  . metFORMIN (GLUCOPHAGE) 1000 MG tablet TAKE 1 TABLET TWICE A DAY WITH MEALS 180 tablet 1  . OVER THE COUNTER MEDICATION Take 1 tablet by mouth daily. Mega Red Joint    . potassium chloride SA (K-DUR,KLOR-CON) 20 MEQ tablet TAKE 1 TABLET DAILY 90 tablet 2  . tadalafil (CIALIS) 5 MG tablet TAKE 1 TABLET EVERY 3 DAYS 30 tablet 3  . colchicine (COLCRYS) 0.6 MG tablet Take 1 tablet (0.6 mg total) by mouth daily as needed. (Patient not taking: Reported on 10/14/2014)  90 tablet 1   No current facility-administered medications on file prior to visit.    BP 100/70 mmHg  Pulse 76  Temp(Src) 98.4 F (36.9 C) (Oral)  Resp 20  Ht 5' 6.25" (1.683 m)  Wt 225 lb (102.059 kg)  BMI 36.03 kg/m2  SpO2 97%     Review of Systems  Constitutional: Negative for fever, chills, appetite change and fatigue.  HENT: Negative for congestion, dental problem, ear pain, hearing loss, sore throat, tinnitus, trouble swallowing and voice change.   Eyes: Negative for pain, discharge and visual disturbance.  Respiratory: Negative for cough, chest tightness, wheezing and stridor.   Cardiovascular: Negative for chest pain, palpitations and leg swelling.  Gastrointestinal: Negative for nausea, vomiting, abdominal pain, diarrhea, constipation, blood in stool and abdominal distention.  Genitourinary: Negative for urgency,  hematuria, flank pain, discharge, difficulty urinating and genital sores.  Musculoskeletal: Positive for back pain. Negative for myalgias, joint swelling, arthralgias, gait problem and neck stiffness.  Skin: Negative for rash.  Neurological: Negative for dizziness, syncope, speech difficulty, weakness, numbness and headaches.  Hematological: Negative for adenopathy. Does not bruise/bleed easily.  Psychiatric/Behavioral: Negative for behavioral problems and dysphoric mood. The patient is not nervous/anxious.        Objective:   Physical Exam  Constitutional: He is oriented to person, place, and time. He appears well-developed. No distress.  Overweight.  Blood pressure low normal   HENT:  Head: Normocephalic.  Right Ear: External ear normal.  Left Ear: External ear normal.  Eyes: Conjunctivae and EOM are normal.  Neck: Normal range of motion.  Cardiovascular: Normal rate, normal heart sounds and intact distal pulses.   Pulmonary/Chest: Breath sounds normal.  Abdominal: Bowel sounds are normal.  Musculoskeletal: Normal range of motion. He exhibits no edema or tenderness.  Neurological: He is alert and oriented to person, place, and time.  Psychiatric: He has a normal mood and affect. His behavior is normal.          Assessment & Plan:   Diabetes mellitus.  Weight loss exercise encouraged.  Will check a hemoglobin A1c.  No change in medicines at this time Essential hypertension, well-controlled Dyslipidemia.  Continue statin therapy Obesity.  Exercise weight loss encouraged  Recheck 6 months

## 2014-12-15 ENCOUNTER — Other Ambulatory Visit: Payer: Self-pay | Admitting: Internal Medicine

## 2015-03-13 ENCOUNTER — Other Ambulatory Visit: Payer: Self-pay | Admitting: Internal Medicine

## 2015-04-16 ENCOUNTER — Encounter: Payer: Self-pay | Admitting: Internal Medicine

## 2015-04-16 ENCOUNTER — Ambulatory Visit (INDEPENDENT_AMBULATORY_CARE_PROVIDER_SITE_OTHER): Payer: BLUE CROSS/BLUE SHIELD | Admitting: Internal Medicine

## 2015-04-16 VITALS — BP 118/76 | HR 94 | Temp 98.6°F | Resp 20 | Ht 66.25 in | Wt 235.0 lb

## 2015-04-16 DIAGNOSIS — E785 Hyperlipidemia, unspecified: Secondary | ICD-10-CM | POA: Diagnosis not present

## 2015-04-16 DIAGNOSIS — E119 Type 2 diabetes mellitus without complications: Secondary | ICD-10-CM | POA: Diagnosis not present

## 2015-04-16 DIAGNOSIS — I10 Essential (primary) hypertension: Secondary | ICD-10-CM

## 2015-04-16 LAB — HEMOGLOBIN A1C: Hgb A1c MFr Bld: 7.2 % — ABNORMAL HIGH (ref 4.6–6.5)

## 2015-04-16 MED ORDER — AMLODIPINE BESYLATE 10 MG PO TABS: 10.0000 mg | ORAL_TABLET | Freq: Every day | ORAL | Status: DC

## 2015-04-16 MED ORDER — BENAZEPRIL-HYDROCHLOROTHIAZIDE 20-25 MG PO TABS: 1.0000 | ORAL_TABLET | Freq: Every day | ORAL | Status: DC

## 2015-04-16 NOTE — Patient Instructions (Signed)
Please check your hemoglobin A1c every 3 months  Limit your sodium (Salt) intake    It is important that you exercise regularly, at least 20 minutes 3 to 4 times per week.  If you develop chest pain or shortness of breath seek  medical attention.  You need to lose weight.  Consider a lower calorie diet and regular exercise. 

## 2015-04-16 NOTE — Progress Notes (Signed)
Subjective:    Patient ID: Kenneth [name], male    DOB: Apr 14, 1955, 60 y.o.   MRN: [id number]  HPI  Lab Results  Component Value Date   HGBA1C 6.6* 10/14/2014    Wt Readings from Last 3 Encounters:  04/16/15 235 lb (106.595 kg)  10/14/14 225 lb (102.059 kg)  05/14/14 226 lb (102.4 kg)   60 year old patient who is seen today for follow-up.  He has type 2 diabetes, dyslipidemia and a history of hypertension.  He has been adjusting to the death of his mother approximately 2 months ago.  No new concerns or complaints.  There is been some weight gain over the holidays.  Generally feels well.  Patient did have a diabetic eye examination last year  Past Medical History  Diagnosis Date  . Hypertension   . Cancer [facility])     prostate- robotic surgery, 2010.  Marland Kitchen Arthritis     ?knees,hips   . Hypercholesteremia   . Hemorrhoids     h/o  . Diabetes mellitus     diagnosed 2008    Social History   Social History  . Marital Status: Married    Spouse Name: N/A  . Number of Children: N/A  . Years of Education: N/A   Occupational History  . Not on file.   Social History Main Topics  . Smoking status: Never Smoker   . Smokeless tobacco: Never Used  . Alcohol Use: 4.2 oz/week    7 Glasses of wine per week  . Drug Use: No  . Sexual Activity: No   Other Topics Concern  . Not on file   Social History Narrative    Past Surgical History  Procedure Laterality Date  . Prostatectomy  October 15th, 2010  . Abdominal hernia repair  02/22/11  . Ventral hernia repair  02/22/2011    Procedure: LAPAROSCOPIC VENTRAL HERNIA;  Surgeon: Merrie Roof, MD;  Location: [place];  Service: General;  Laterality: N/A;  laparoscopic ventral hernia repair with mesh  . Hernia repair  02/22/11    vental hernia    Family History  Problem Relation Age of Onset  . Hypertension Mother   . Diabetes Father   . Hypertension Father   . Anesthesia problems Neg Hx   . Hypotension Neg Hx   .  Malignant hyperthermia Neg Hx   . Pseudochol deficiency Neg Hx     No Known Allergies  Current Outpatient Prescriptions on File Prior to Visit  Medication Sig Dispense Refill  . atorvastatin (LIPITOR) 40 MG tablet Take 1 tablet (40 mg total) by mouth daily. 90 tablet 1  . atorvastatin (LIPITOR) 40 MG tablet TAKE 1 TABLET DAILY 90 tablet 1  . cetirizine (ZYRTEC) 10 MG tablet Take 10 mg by mouth daily.    . colchicine (COLCRYS) 0.6 MG tablet Take 1 tablet (0.6 mg total) by mouth daily as needed. 90 tablet 1  . fluticasone (FLONASE) 50 MCG/ACT nasal spray Place 2 sprays into both nostrils daily. 48 g 3  . metFORMIN (GLUCOPHAGE) 1000 MG tablet TAKE 1 TABLET TWICE A DAY WITH MEALS 180 tablet 1  . OVER THE COUNTER MEDICATION Take 1 tablet by mouth daily. Mega Red Joint    . potassium chloride SA (K-DUR,KLOR-CON) 20 MEQ tablet TAKE 1 TABLET DAILY 90 tablet 2  . tadalafil (CIALIS) 5 MG tablet TAKE 1 TABLET EVERY 3 DAYS 30 tablet 3   No current facility-administered medications on file prior to visit.    BP  118/76 mmHg  Pulse 94  Temp(Src) 98.6 F (37 C) (Oral)  Resp 20  Ht 5' 6.25" (1.683 m)  Wt 235 lb (106.595 kg)  BMI 37.63 kg/m2  SpO2 97%    Review of Systems  Constitutional: Positive for unexpected weight change. Negative for fever, chills, appetite change and fatigue.  HENT: Negative for congestion, dental problem, ear pain, hearing loss, sore throat, tinnitus, trouble swallowing and voice change.   Eyes: Negative for pain, discharge and visual disturbance.  Respiratory: Negative for cough, chest tightness, wheezing and stridor.   Cardiovascular: Negative for chest pain, palpitations and leg swelling.  Gastrointestinal: Negative for nausea, vomiting, abdominal pain, diarrhea, constipation, blood in stool and abdominal distention.  Genitourinary: Negative for urgency, hematuria, flank pain, discharge, difficulty urinating and genital sores.  Musculoskeletal: Negative for  myalgias, back pain, joint swelling, arthralgias, gait problem and neck stiffness.  Skin: Negative for rash.  Neurological: Negative for dizziness, syncope, speech difficulty, weakness, numbness and headaches.  Hematological: Negative for adenopathy. Does not bruise/bleed easily.  Psychiatric/Behavioral: Negative for behavioral problems and dysphoric mood. The patient is not nervous/anxious.        Objective:   Physical Exam  Constitutional: He is oriented to person, place, and time. He appears well-developed.  HENT:  Head: Normocephalic.  Right Ear: External ear normal.  Left Ear: External ear normal.  Eyes: Conjunctivae and EOM are normal.  Neck: Normal range of motion.  Cardiovascular: Normal rate and normal heart sounds.   Pulmonary/Chest: Breath sounds normal.  Abdominal: Bowel sounds are normal.  Musculoskeletal: Normal range of motion. He exhibits no edema or tenderness.  Neurological: He is alert and oriented to person, place, and time.  Psychiatric: He has a normal mood and affect. His behavior is normal.          Assessment & Plan:   Diabetes mellitus.  Will check a hemoglobin A1c Weight gain, weight loss encouraged Essential hypertension.  Excellent control Dyslipidemia.  Continue statin therapy  CPX 6 months

## 2015-04-16 NOTE — Progress Notes (Signed)
Pre visit review using our clinic review tool, if applicable. No additional management support is needed unless otherwise documented below in the visit note. 

## 2015-05-21 ENCOUNTER — Other Ambulatory Visit: Payer: Self-pay | Admitting: Internal Medicine

## 2015-08-11 ENCOUNTER — Other Ambulatory Visit: Payer: Self-pay | Admitting: Internal Medicine

## 2015-08-26 ENCOUNTER — Other Ambulatory Visit: Payer: Self-pay | Admitting: *Deleted

## 2015-08-26 MED ORDER — METFORMIN HCL 1000 MG PO TABS: 1000.0000 mg | ORAL_TABLET | Freq: Two times a day (BID) | ORAL | Status: DC

## 2015-09-27 ENCOUNTER — Other Ambulatory Visit: Payer: Self-pay | Admitting: Internal Medicine

## 2015-10-12 ENCOUNTER — Other Ambulatory Visit: Payer: Self-pay | Admitting: Internal Medicine

## 2015-10-15 ENCOUNTER — Encounter: Payer: Self-pay | Admitting: Internal Medicine

## 2015-10-15 ENCOUNTER — Ambulatory Visit (INDEPENDENT_AMBULATORY_CARE_PROVIDER_SITE_OTHER): Payer: BLUE CROSS/BLUE SHIELD | Admitting: Internal Medicine

## 2015-10-15 VITALS — BP 120/84 | HR 73 | Temp 98.1°F | Wt 203.8 lb

## 2015-10-15 DIAGNOSIS — E119 Type 2 diabetes mellitus without complications: Secondary | ICD-10-CM | POA: Diagnosis not present

## 2015-10-15 DIAGNOSIS — E785 Hyperlipidemia, unspecified: Secondary | ICD-10-CM | POA: Diagnosis not present

## 2015-10-15 DIAGNOSIS — I10 Essential (primary) hypertension: Secondary | ICD-10-CM

## 2015-10-15 LAB — HEMOGLOBIN A1C: HEMOGLOBIN A1C: 6.2 % (ref 4.6–6.5)

## 2015-10-15 NOTE — Progress Notes (Signed)
Pre visit review using our clinic review tool, if applicable. No additional management support is needed unless otherwise documented below in the visit note. 

## 2015-10-15 NOTE — Patient Instructions (Signed)
Limit your sodium (Salt) intake   Please check your hemoglobin A1c every 6 months    It is important that you exercise regularly, at least 20 minutes 3 to 4 times per week.  If you develop chest pain or shortness of breath seek  medical attention.  You need to lose weight.  Consider a lower calorie diet and regular exercise.  

## 2015-10-15 NOTE — Progress Notes (Signed)
Subjective:    Patient ID: Kenneth [name], male    DOB: February 07, 1956, 60 y.o.   MRN: [id number]  HPI Lab Results  Component Value Date   HGBA1C 7.2 (H) 04/16/2015   BP Readings from Last 3 Encounters:  10/15/15 120/84  04/16/15 118/76  10/14/14 100/70   Wt Readings from Last 3 Encounters:  10/15/15 203 lb 12.8 oz (92.4 kg)  04/16/15 235 lb (106.6 kg)  10/14/14 225 lb (34.37 kg)   60 year old patient who is seen today for follow-up of type 2 diabetes.  He has been on a diet and over the past 6 months has lost 32 pounds in weight.  He feels quite well. He has essential hypertension which also has been well-controlled.  No concerns or complaints.  Past Medical History:  Diagnosis Date  . Arthritis    ?knees,hips   . Cancer [facility])    prostate- robotic surgery, 2010.  . Diabetes mellitus    diagnosed 2008  . Hemorrhoids    h/o  . Hypercholesteremia   . Hypertension      Social History   Social History  . Marital status: Married    Spouse name: N/A  . Number of children: N/A  . Years of education: N/A   Occupational History  . Not on file.   Social History Main Topics  . Smoking status: Never Smoker  . Smokeless tobacco: Never Used  . Alcohol use 4.2 oz/week    7 Glasses of wine per week  . Drug use: No  . Sexual activity: No   Other Topics Concern  . Not on file   Social History Narrative  . No narrative on file    Past Surgical History:  Procedure Laterality Date  . ABDOMINAL HERNIA REPAIR  02/22/11  . HERNIA REPAIR  02/22/11   vental hernia  . PROSTATECTOMY  October 15th, 2010  . VENTRAL HERNIA REPAIR  02/22/2011   Procedure: LAPAROSCOPIC VENTRAL HERNIA;  Surgeon: Merrie Roof, MD;  Location: [place];  Service: General;  Laterality: N/A;  laparoscopic ventral hernia repair with mesh    Family History  Problem Relation Age of Onset  . Hypertension Mother   . Diabetes Father   . Hypertension Father   . Anesthesia problems Neg Hx   .  Hypotension Neg Hx   . Malignant hyperthermia Neg Hx   . Pseudochol deficiency Neg Hx     No Known Allergies  Current Outpatient Prescriptions on File Prior to Visit  Medication Sig Dispense Refill  . amLODipine (NORVASC) 10 MG tablet Take 1 tablet by mouth  daily 90 tablet 1  . atorvastatin (LIPITOR) 40 MG tablet Take 1 tablet (40 mg total) by mouth daily. 90 tablet 1  . benazepril-hydrochlorthiazide (LOTENSIN HCT) 20-25 MG tablet Take 1 tablet by mouth  daily 90 tablet 1  . cetirizine (ZYRTEC) 10 MG tablet Take 10 mg by mouth daily.    Marland Kitchen CIALIS 5 MG tablet Take 1 tablet every 3 days 30 tablet 4  . colchicine (COLCRYS) 0.6 MG tablet Take 1 tablet (0.6 mg total) by mouth daily as needed. 90 tablet 1  . fluticasone (FLONASE) 50 MCG/ACT nasal spray Place 2 sprays into both nostrils daily. 48 g 3  . metFORMIN (GLUCOPHAGE) 1000 MG tablet Take 1 tablet (1,000 mg total) by mouth 2 (two) times daily with a meal. 180 tablet 1  . OVER THE COUNTER MEDICATION Take 1 tablet by mouth daily. Mega Red Joint    .  potassium chloride SA (K-DUR,KLOR-CON) 20 MEQ tablet Take 1 tablet daily 90 tablet 1   No current facility-administered medications on file prior to visit.     BP 120/84 (BP Location: Left Arm, Patient Position: Sitting, Cuff Size: Large)   Pulse 73   Temp 98.1 F (36.7 C) (Oral)   Wt 203 lb 12.8 oz (92.4 kg)   SpO2 99%   BMI 32.65 kg/m     Review of Systems  Constitutional: Negative for appetite change, chills, fatigue and fever.  HENT: Negative for congestion, dental problem, ear pain, hearing loss, sore throat, tinnitus, trouble swallowing and voice change.   Eyes: Negative for pain, discharge and visual disturbance.  Respiratory: Negative for cough, chest tightness, wheezing and stridor.   Cardiovascular: Negative for chest pain, palpitations and leg swelling.  Gastrointestinal: Negative for abdominal distention, abdominal pain, blood in stool, constipation, diarrhea, nausea and  vomiting.  Genitourinary: Negative for difficulty urinating, discharge, flank pain, genital sores, hematuria and urgency.  Musculoskeletal: Negative for arthralgias, back pain, gait problem, joint swelling, myalgias and neck stiffness.  Skin: Negative for rash.  Neurological: Negative for dizziness, syncope, speech difficulty, weakness, numbness and headaches.  Hematological: Negative for adenopathy. Does not bruise/bleed easily.  Psychiatric/Behavioral: Negative for behavioral problems and dysphoric mood. The patient is not nervous/anxious.        Objective:   Physical Exam  Constitutional: He is oriented to person, place, and time. He appears well-developed.  HENT:  Head: Normocephalic.  Right Ear: External ear normal.  Left Ear: External ear normal.  Eyes: Conjunctivae and EOM are normal.  Neck: Normal range of motion.  Cardiovascular: Normal rate and normal heart sounds.   Pulmonary/Chest: Breath sounds normal.  Abdominal: Bowel sounds are normal.  Musculoskeletal: Normal range of motion. He exhibits no edema or tenderness.  Neurological: He is alert and oriented to person, place, and time.  Psychiatric: He has a normal mood and affect. His behavior is normal.          Assessment & Plan:   Diabetes mellitus.  Appears to be very well controlled.  Blood sugars are often less than 90.  We'll check a hemoglobin A1c.  Essential hypertension, stable Voluntary weight loss  CPX 6 months No change in therapy  [name], MD

## 2015-10-19 DIAGNOSIS — Z8546 Personal history of malignant neoplasm of prostate: Secondary | ICD-10-CM | POA: Diagnosis not present

## 2015-10-26 DIAGNOSIS — Z8546 Personal history of malignant neoplasm of prostate: Secondary | ICD-10-CM | POA: Diagnosis not present

## 2016-03-13 HISTORY — PX: COLONOSCOPY: SHX174

## 2016-04-10 ENCOUNTER — Other Ambulatory Visit (INDEPENDENT_AMBULATORY_CARE_PROVIDER_SITE_OTHER): Payer: BLUE CROSS/BLUE SHIELD

## 2016-04-10 DIAGNOSIS — Z0000 Encounter for general adult medical examination without abnormal findings: Secondary | ICD-10-CM | POA: Diagnosis not present

## 2016-04-10 LAB — POC URINALSYSI DIPSTICK (AUTOMATED)
BILIRUBIN UA: NEGATIVE
GLUCOSE UA: NEGATIVE
KETONES UA: NEGATIVE
Leukocytes, UA: NEGATIVE
NITRITE UA: NEGATIVE
Protein, UA: NEGATIVE
RBC UA: NEGATIVE
Spec Grav, UA: 1.025
Urobilinogen, UA: 0.2
pH, UA: 5.5

## 2016-04-10 LAB — CBC WITH DIFFERENTIAL/PLATELET
BASOS ABS: 0.1 10*3/uL (ref 0.0–0.1)
Basophils Relative: 1.1 % (ref 0.0–3.0)
EOS ABS: 0.2 10*3/uL (ref 0.0–0.7)
EOS PCT: 4.5 % (ref 0.0–5.0)
HCT: 41.6 % (ref 39.0–52.0)
HEMOGLOBIN: 14 g/dL (ref 13.0–17.0)
LYMPHS ABS: 2 10*3/uL (ref 0.7–4.0)
Lymphocytes Relative: 42.9 % (ref 12.0–46.0)
MCHC: 33.8 g/dL (ref 30.0–36.0)
MCV: 84 fl (ref 78.0–100.0)
MONO ABS: 0.5 10*3/uL (ref 0.1–1.0)
Monocytes Relative: 9.8 % (ref 3.0–12.0)
NEUTROS PCT: 41.7 % — AB (ref 43.0–77.0)
Neutro Abs: 2 10*3/uL (ref 1.4–7.7)
Platelets: 299 10*3/uL (ref 150.0–400.0)
RBC: 4.95 Mil/uL (ref 4.22–5.81)
RDW: 15.4 % (ref 11.5–15.5)
WBC: 4.8 10*3/uL (ref 4.0–10.5)

## 2016-04-10 LAB — BASIC METABOLIC PANEL
BUN: 14 mg/dL (ref 6–23)
CALCIUM: 9.7 mg/dL (ref 8.4–10.5)
CO2: 31 mEq/L (ref 19–32)
Chloride: 102 mEq/L (ref 96–112)
Creatinine, Ser: 1.1 mg/dL (ref 0.40–1.50)
GFR: 87.89 mL/min (ref >60.00)
Glucose, Bld: 122 mg/dL — ABNORMAL HIGH (ref 70–99)
POTASSIUM: 4 meq/L (ref 3.5–5.1)
Sodium: 140 mEq/L (ref 135–145)

## 2016-04-10 LAB — LIPID PANEL
CHOL/HDL RATIO: 4
Cholesterol: 182 mg/dL (ref 0–200)
HDL: 51.1 mg/dL (ref >39.00)
LDL CALC: 115 mg/dL — AB (ref 0–99)
NonHDL: 130.6
Triglycerides: 80 mg/dL (ref 0.0–149.0)
VLDL: 16 mg/dL (ref 0.0–40.0)

## 2016-04-10 LAB — HEPATIC FUNCTION PANEL
ALK PHOS: 53 U/L (ref 39–117)
ALT: 7 U/L (ref 0–53)
AST: 18 U/L (ref 0–37)
Albumin: 4.5 g/dL (ref 3.5–5.2)
BILIRUBIN DIRECT: 0.1 mg/dL (ref 0.0–0.3)
BILIRUBIN TOTAL: 0.5 mg/dL (ref 0.2–1.2)
Total Protein: 7.6 g/dL (ref 6.0–8.3)

## 2016-04-10 LAB — TSH: TSH: 1.12 u[IU]/mL (ref 0.35–4.50)

## 2016-04-10 LAB — MICROALBUMIN / CREATININE URINE RATIO
CREATININE, U: 260.9 mg/dL
MICROALB UR: 2.3 mg/dL — AB (ref 0.0–1.9)
Microalb Creat Ratio: 0.9 mg/g (ref 0.0–30.0)

## 2016-04-10 LAB — PSA: PSA: 0.05 ng/mL — AB (ref 0.10–4.00)

## 2016-04-10 LAB — HEMOGLOBIN A1C: Hgb A1c MFr Bld: 6.7 % — ABNORMAL HIGH (ref 4.6–6.5)

## 2016-04-17 ENCOUNTER — Ambulatory Visit (INDEPENDENT_AMBULATORY_CARE_PROVIDER_SITE_OTHER): Payer: BLUE CROSS/BLUE SHIELD | Admitting: Internal Medicine

## 2016-04-17 ENCOUNTER — Encounter: Payer: Self-pay | Admitting: Internal Medicine

## 2016-04-17 VITALS — BP 142/80 | HR 84 | Temp 98.0°F | Ht 65.5 in | Wt 206.0 lb

## 2016-04-17 DIAGNOSIS — Z23 Encounter for immunization: Secondary | ICD-10-CM

## 2016-04-17 DIAGNOSIS — Z0000 Encounter for general adult medical examination without abnormal findings: Secondary | ICD-10-CM

## 2016-04-17 NOTE — Patient Instructions (Signed)
Limit your sodium (Salt) intake   Please check your hemoglobin A1c every 3-6  Months  Please see your eye doctor yearly to check for diabetic eye damage    It is important that you exercise regularly, at least 20 minutes 3 to 4 times per week.  If you develop chest pain or shortness of breath seek  medical attention.  You need to lose weight.  Consider a lower calorie diet and regular exercise.  Return in 6 months for follow-up

## 2016-04-17 NOTE — Progress Notes (Signed)
Subjective:    Patient ID: Kenneth [name], male    DOB: 25-Aug-1955, 61 y.o.   MRN: [id number]  HPI  61 year old patient who is seen today for a preventive health examination History of type 2 diabetes which has been well-controlled on metformin therapy.  He has a history of essential hypertension , dyslipidemia and gout.  He has done quite well.  He has had a prior ventral hernia repair. He has a history of prostate cancer and is status post resection.  Wt Readings from Last 3 Encounters:  04/17/16 206 lb (93.4 kg)  10/15/15 203 lb 12.8 oz (92.4 kg)  04/16/15 235 lb (106.6 kg)   Last colonoscopy 2008  No new concerns or complaints.  Past Medical History:  Diagnosis Date  . Arthritis    ?knees,hips   . Cancer [facility])    prostate- robotic surgery, 2010.  . Diabetes mellitus    diagnosed 2008  . Hemorrhoids    h/o  . Hypercholesteremia   . Hypertension      Social History   Social History  . Marital status: Married    Spouse name: N/A  . Number of children: N/A  . Years of education: N/A   Occupational History  . Not on file.   Social History Main Topics  . Smoking status: Never Smoker  . Smokeless tobacco: Never Used  . Alcohol use 4.2 oz/week    7 Glasses of wine per week  . Drug use: No  . Sexual activity: No   Other Topics Concern  . Not on file   Social History Narrative  . No narrative on file    Past Surgical History:  Procedure Laterality Date  . ABDOMINAL HERNIA REPAIR  02/22/11  . HERNIA REPAIR  02/22/11   vental hernia  . PROSTATECTOMY  October 15th, 2010  . VENTRAL HERNIA REPAIR  02/22/2011   Procedure: LAPAROSCOPIC VENTRAL HERNIA;  Surgeon: Merrie Roof, MD;  Location: [place];  Service: General;  Laterality: N/A;  laparoscopic ventral hernia repair with mesh    Family History  Problem Relation Age of Onset  . Hypertension Mother   . Diabetes Father   . Hypertension Father   . Anesthesia problems Neg Hx   . Hypotension Neg Hx     . Malignant hyperthermia Neg Hx   . Pseudochol deficiency Neg Hx     No Known Allergies  Current Outpatient Prescriptions on File Prior to Visit  Medication Sig Dispense Refill  . amLODipine (NORVASC) 10 MG tablet Take 1 tablet by mouth  daily 90 tablet 1  . atorvastatin (LIPITOR) 40 MG tablet Take 1 tablet (40 mg total) by mouth daily. 90 tablet 1  . benazepril-hydrochlorthiazide (LOTENSIN HCT) 20-25 MG tablet Take 1 tablet by mouth  daily 90 tablet 1  . cetirizine (ZYRTEC) 10 MG tablet Take 10 mg by mouth daily.    . fluticasone (FLONASE) 50 MCG/ACT nasal spray Place 2 sprays into both nostrils daily. 48 g 3  . metFORMIN (GLUCOPHAGE) 1000 MG tablet Take 1 tablet (1,000 mg total) by mouth 2 (two) times daily with a meal. 180 tablet 1  . OVER THE COUNTER MEDICATION Take 1 tablet by mouth daily. Mega Red Joint    . potassium chloride SA (K-DUR,KLOR-CON) 20 MEQ tablet Take 1 tablet daily 90 tablet 1   No current facility-administered medications on file prior to visit.     BP (!) 142/80 (BP Location: Left Arm, Patient Position: Sitting, Cuff Size: Normal)  Pulse 84   Temp 98 F (36.7 C) (Oral)   Ht 5' 5.5" (1.664 m)   Wt 206 lb (93.4 kg)   SpO2 98%   BMI 33.76 kg/m     Review of Systems  Constitutional: Negative for appetite change, chills, fatigue and fever.  HENT: Negative for congestion, dental problem, ear pain, hearing loss, sore throat, tinnitus, trouble swallowing and voice change.   Eyes: Negative for pain, discharge and visual disturbance.  Respiratory: Negative for cough, chest tightness, wheezing and stridor.   Cardiovascular: Negative for chest pain, palpitations and leg swelling.  Gastrointestinal: Negative for abdominal distention, abdominal pain, blood in stool, constipation, diarrhea, nausea and vomiting.  Genitourinary: Negative for difficulty urinating, discharge, flank pain, genital sores, hematuria and urgency.  Musculoskeletal: Negative for arthralgias,  back pain, gait problem, joint swelling, myalgias and neck stiffness.  Skin: Negative for rash.  Neurological: Negative for dizziness, syncope, speech difficulty, weakness, numbness and headaches.  Hematological: Negative for adenopathy. Does not bruise/bleed easily.  Psychiatric/Behavioral: Negative for behavioral problems and dysphoric mood. The patient is not nervous/anxious.        Objective:   Physical Exam  Constitutional: He appears well-developed and well-nourished.  HENT:  Head: Normocephalic and atraumatic.  Right Ear: External ear normal.  Left Ear: External ear normal.  Nose: Nose normal.  Mouth/Throat: Oropharynx is clear and moist.  Eyes: Conjunctivae and EOM are normal. Pupils are equal, round, and reactive to light. No scleral icterus.  Neck: Normal range of motion. Neck supple. No JVD present. No thyromegaly present.  Cardiovascular: Regular rhythm, normal heart sounds and intact distal pulses.  Exam reveals no gallop and no friction rub.   No murmur heard. Pulmonary/Chest: Effort normal and breath sounds normal. He exhibits no tenderness.  Abdominal: Soft. Bowel sounds are normal. He exhibits no distension and no mass. There is no tenderness.  Small surgical scar mid abdominal area  Genitourinary: Penis normal. Rectal exam shows guaiac negative stool.  Genitourinary Comments: Prostate surgically absent uncircumcised  Musculoskeletal: Normal range of motion. He exhibits no edema or tenderness.  Lymphadenopathy:    He has no cervical adenopathy.  Neurological: He is alert. He has normal reflexes. No cranial nerve deficit. Coordination normal.  Skin: Skin is warm and dry. No rash noted.  Psychiatric: He has a normal mood and affect. His behavior is normal.          Assessment & Plan:   Preventive health examination.  Needs follow-up colonoscopy within the next year Diabetes mellitus stable.  No change in therapy.  Weight loss encouraged Mild obesity.  Weight  loss encouraged present weight 206 Essential hypertension, controlled Dyslipidemia.  Continue statin therapy  Recheck 6 months Schedule colonoscopy at that time  Kenneth [name]

## 2016-04-17 NOTE — Progress Notes (Signed)
Pre visit review using our clinic review tool, if applicable. No additional management support is needed unless otherwise documented below in the visit note. 

## 2016-04-26 DIAGNOSIS — Z8546 Personal history of malignant neoplasm of prostate: Secondary | ICD-10-CM | POA: Diagnosis not present

## 2016-05-03 DIAGNOSIS — N5201 Erectile dysfunction due to arterial insufficiency: Secondary | ICD-10-CM | POA: Diagnosis not present

## 2016-05-03 DIAGNOSIS — Z8546 Personal history of malignant neoplasm of prostate: Secondary | ICD-10-CM | POA: Diagnosis not present

## 2016-05-25 ENCOUNTER — Other Ambulatory Visit: Payer: Self-pay | Admitting: Internal Medicine

## 2016-06-13 DIAGNOSIS — H2513 Age-related nuclear cataract, bilateral: Secondary | ICD-10-CM | POA: Diagnosis not present

## 2016-06-13 DIAGNOSIS — E119 Type 2 diabetes mellitus without complications: Secondary | ICD-10-CM | POA: Diagnosis not present

## 2016-06-13 LAB — HM DIABETES EYE EXAM

## 2016-07-05 ENCOUNTER — Other Ambulatory Visit: Payer: Self-pay | Admitting: Internal Medicine

## 2016-07-10 ENCOUNTER — Other Ambulatory Visit: Payer: Self-pay | Admitting: Internal Medicine

## 2016-07-10 MED ORDER — ATORVASTATIN CALCIUM 40 MG PO TABS: 40.0000 mg | ORAL_TABLET | Freq: Every day | ORAL | 1 refills | Status: DC

## 2016-08-02 ENCOUNTER — Encounter: Payer: Self-pay | Admitting: Internal Medicine

## 2016-10-16 ENCOUNTER — Encounter: Payer: Self-pay | Admitting: Internal Medicine

## 2016-10-16 ENCOUNTER — Ambulatory Visit (INDEPENDENT_AMBULATORY_CARE_PROVIDER_SITE_OTHER): Payer: BLUE CROSS/BLUE SHIELD | Admitting: Internal Medicine

## 2016-10-16 VITALS — BP 124/78 | HR 95 | Temp 98.1°F | Ht 65.5 in | Wt 217.2 lb

## 2016-10-16 DIAGNOSIS — M545 Low back pain, unspecified: Secondary | ICD-10-CM

## 2016-10-16 DIAGNOSIS — E785 Hyperlipidemia, unspecified: Secondary | ICD-10-CM

## 2016-10-16 DIAGNOSIS — G8929 Other chronic pain: Secondary | ICD-10-CM | POA: Diagnosis not present

## 2016-10-16 DIAGNOSIS — E119 Type 2 diabetes mellitus without complications: Secondary | ICD-10-CM | POA: Diagnosis not present

## 2016-10-16 LAB — POCT GLYCOSYLATED HEMOGLOBIN (HGB A1C): HEMOGLOBIN A1C: 6.5

## 2016-10-16 MED ORDER — FLUTICASONE PROPIONATE 50 MCG/ACT NA SUSP: 2.0000 | Freq: Every day | NASAL | 3 refills | Status: DC

## 2016-10-16 NOTE — Patient Instructions (Signed)
Limit your sodium (Salt) intake   Please check your hemoglobin A1c every 3-6  Months  Schedule your colonoscopy to help detect colon cancer.    It is important that you exercise regularly, at least 20 minutes 3 to 4 times per week.  If you develop chest pain or shortness of breath seek  medical attention.  You need to lose weight.  Consider a lower calorie diet and regular exercise.

## 2016-10-16 NOTE — Progress Notes (Signed)
Subjective:    Patient ID: Kenneth [name], male    DOB: 02-02-1956, 61 y.o.   MRN: [id number]  HPI  Lab Results  Component Value Date   HGBA1C 6.7 (H) 04/10/2016   61 year old patient who is seen today for his six-month follow-up.  He has a history of type 2 diabetes which has been controlled on metformin therapy only. He had his annual eye examination.  3 months ago. He is scheduled for follow-up colonoscopy soon. He has hypertension and dyslipidemia.  No cardiopulmonary complaints No history of gout  Past Medical History:  Diagnosis Date  . Arthritis    ?knees,hips   . Cancer [facility])    prostate- robotic surgery, 2010.  . Diabetes mellitus    diagnosed 2008  . Hemorrhoids    h/o  . Hypercholesteremia   . Hypertension      Social History   Social History  . Marital status: Married    Spouse name: N/A  . Number of children: N/A  . Years of education: N/A   Occupational History  . Not on file.   Social History Main Topics  . Smoking status: Never Smoker  . Smokeless tobacco: Never Used  . Alcohol use 4.2 oz/week    7 Glasses of wine per week  . Drug use: No  . Sexual activity: No   Other Topics Concern  . Not on file   Social History Narrative  . No narrative on file    Past Surgical History:  Procedure Laterality Date  . ABDOMINAL HERNIA REPAIR  02/22/11  . HERNIA REPAIR  02/22/11   vental hernia  . PROSTATECTOMY  October 15th, 2010  . VENTRAL HERNIA REPAIR  02/22/2011   Procedure: LAPAROSCOPIC VENTRAL HERNIA;  Surgeon: Merrie Roof, MD;  Location: [place];  Service: General;  Laterality: N/A;  laparoscopic ventral hernia repair with mesh    Family History  Problem Relation Age of Onset  . Hypertension Mother   . Diabetes Father   . Hypertension Father   . Anesthesia problems Neg Hx   . Hypotension Neg Hx   . Malignant hyperthermia Neg Hx   . Pseudochol deficiency Neg Hx     No Known Allergies  Current Outpatient Prescriptions on File  Prior to Visit  Medication Sig Dispense Refill  . amLODipine (NORVASC) 10 MG tablet TAKE 1 TABLET BY MOUTH  DAILY 90 tablet 0  . atorvastatin (LIPITOR) 40 MG tablet Take 1 tablet (40 mg total) by mouth daily. 90 tablet 1  . benazepril-hydrochlorthiazide (LOTENSIN HCT) 20-25 MG tablet TAKE 1 TABLET BY MOUTH  DAILY 90 tablet 0  . cetirizine (ZYRTEC) 10 MG tablet Take 10 mg by mouth daily.    . metFORMIN (GLUCOPHAGE) 1000 MG tablet TAKE 1 TABLET BY MOUTH TWO  TIMES DAILY WITH A MEAL 180 tablet 0  . OVER THE COUNTER MEDICATION Take 1 tablet by mouth daily. Mega Red Joint    . potassium chloride SA (K-DUR,KLOR-CON) 20 MEQ tablet TAKE 1 TABLET BY MOUTH  DAILY 90 tablet 1   No current facility-administered medications on file prior to visit.     BP (!) 148/76 (BP Location: Left Arm, Patient Position: Sitting, Cuff Size: Normal)   Pulse 95   Temp 98.1 F (36.7 C) (Oral)   Ht 5' 5.5" (1.664 m)   Wt 217 lb 3.2 oz (98.5 kg)   SpO2 98%   BMI 35.59 kg/m     Review of Systems  Constitutional: Negative for  appetite change, chills, fatigue and fever.  HENT: Negative for congestion, dental problem, ear pain, hearing loss, sore throat, tinnitus, trouble swallowing and voice change.   Eyes: Negative for pain, discharge and visual disturbance.  Respiratory: Negative for cough, chest tightness, wheezing and stridor.   Cardiovascular: Negative for chest pain, palpitations and leg swelling.  Gastrointestinal: Negative for abdominal distention, abdominal pain, blood in stool, constipation, diarrhea, nausea and vomiting.  Genitourinary: Negative for difficulty urinating, discharge, flank pain, genital sores, hematuria and urgency.  Musculoskeletal: Negative for arthralgias, back pain, gait problem, joint swelling, myalgias and neck stiffness.  Skin: Negative for rash.  Neurological: Negative for dizziness, syncope, speech difficulty, weakness, numbness and headaches.  Hematological: Negative for  adenopathy. Does not bruise/bleed easily.  Psychiatric/Behavioral: Negative for behavioral problems and dysphoric mood. The patient is not nervous/anxious.        Objective:   Physical Exam  Constitutional: He is oriented to person, place, and time. He appears well-developed.  HENT:  Head: Normocephalic.  Right Ear: External ear normal.  Left Ear: External ear normal.  Eyes: Conjunctivae and EOM are normal.  Neck: Normal range of motion.  Cardiovascular: Normal rate, normal heart sounds and intact distal pulses.   Pulmonary/Chest: Breath sounds normal.  Abdominal: Bowel sounds are normal.  Musculoskeletal: Normal range of motion. He exhibits no edema or tenderness.  Neurological: He is alert and oriented to person, place, and time.  Psychiatric: He has a normal mood and affect. His behavior is normal.          Assessment & Plan:   Diabetes mellitus.  Will review a hemoglobin A1c Essential hypertension, controlled Dyslipidemia.  Continue statin therapy History of gout.  Stable Weight gain.  Weight loss encouraged  CPX 6 months  [name]

## 2016-12-06 ENCOUNTER — Ambulatory Visit (AMBULATORY_SURGERY_CENTER): Payer: Self-pay | Admitting: *Deleted

## 2016-12-06 VITALS — Ht 67.0 in | Wt 228.6 lb

## 2016-12-06 DIAGNOSIS — Z1211 Encounter for screening for malignant neoplasm of colon: Secondary | ICD-10-CM

## 2016-12-06 MED ORDER — NA SULFATE-K SULFATE-MG SULF 17.5-3.13-1.6 GM/177ML PO SOLN: ORAL | 0 refills | Status: DC

## 2016-12-06 NOTE — Progress Notes (Signed)
No allergies to eggs or soy. No problems with anesthesia.  Pt given Emmi instructions for colonoscopy  No oxygen use  No diet drug use  

## 2016-12-13 ENCOUNTER — Other Ambulatory Visit: Payer: Self-pay | Admitting: Internal Medicine

## 2016-12-15 ENCOUNTER — Other Ambulatory Visit: Payer: Self-pay | Admitting: Internal Medicine

## 2016-12-19 ENCOUNTER — Ambulatory Visit (AMBULATORY_SURGERY_CENTER): Payer: BLUE CROSS/BLUE SHIELD | Admitting: Internal Medicine

## 2016-12-19 ENCOUNTER — Encounter: Payer: Self-pay | Admitting: Internal Medicine

## 2016-12-19 VITALS — BP 106/75 | HR 70 | Temp 98.6°F | Resp 12 | Ht 67.0 in | Wt 228.0 lb

## 2016-12-19 DIAGNOSIS — Z1211 Encounter for screening for malignant neoplasm of colon: Secondary | ICD-10-CM | POA: Diagnosis not present

## 2016-12-19 DIAGNOSIS — D122 Benign neoplasm of ascending colon: Secondary | ICD-10-CM

## 2016-12-19 DIAGNOSIS — D123 Benign neoplasm of transverse colon: Secondary | ICD-10-CM

## 2016-12-19 DIAGNOSIS — Z1212 Encounter for screening for malignant neoplasm of rectum: Secondary | ICD-10-CM

## 2016-12-19 DIAGNOSIS — K635 Polyp of colon: Secondary | ICD-10-CM | POA: Diagnosis not present

## 2016-12-19 MED ORDER — SODIUM CHLORIDE 0.9 % IV SOLN: 500.0000 mL | INTRAVENOUS | Status: DC

## 2016-12-19 NOTE — Op Note (Signed)
[employer] Patient Name: Kenneth [name] Procedure Date: 12/19/2016 9:25 AM MRN: [id number] Endoscopist: Docia Chuck. Henrene Pastor , MD Age: 61 Referring MD:  Date of Birth: 09/20/1955 Gender: Male Account #: [id number] Procedure:                Colonoscopy, with cold snare polypectomy x 2 Indications:              Screening for colorectal malignant neoplasm.                            Negative index exam 2008 Medicines:                Monitored Anesthesia Care Procedure:                Pre-Anesthesia Assessment:                           - Prior to the procedure, a History and Physical                            was performed, and patient medications and                            allergies were reviewed. The patient's tolerance of                            previous anesthesia was also reviewed. The risks                            and benefits of the procedure and the sedation                            options and risks were discussed with the patient.                            All questions were answered, and informed consent                            was obtained. Prior Anticoagulants: The patient has                            taken no previous anticoagulant or antiplatelet                            agents. ASA Grade Assessment: II - A patient with                            mild systemic disease. After reviewing the risks                            and benefits, the patient was deemed in                            satisfactory condition to undergo the procedure.  After obtaining informed consent, the colonoscope                            was passed under direct vision. Throughout the                            procedure, the patient's blood pressure, pulse, and                            oxygen saturations were monitored continuously. The                            Colonoscope was introduced through the anus and                            advanced to  the the cecum, identified by                            appendiceal orifice and ileocecal valve. The                            ileocecal valve, appendiceal orifice, and rectum                            were photographed. The quality of the bowel                            preparation was UNSATISFACTORY. The colonoscopy was                            performed without difficulty. The patient tolerated                            the procedure well. The bowel preparation used was                            SUPREP. Scope In: 9:32:00 AM Scope Out: 9:47:44 AM Scope Withdrawal Time: 0 hours 12 minutes 44 seconds  Total Procedure Duration: 0 hours 15 minutes 44 seconds  Findings:                 Two polyps were found in the proximal transverse                            colon and ascending colon. The polyps were 2 to 3                            mm in size.                           Multiple diverticula were found in the left colon                            and right colon.  Internal hemorrhoids were found during retroflexion.                           The exam was otherwise without abnormality on                            direct and retroflexion views. Complications:            No immediate complications. Estimated blood loss:                            None. Estimated Blood Loss:     Estimated blood loss: none. Impression:               - Preparation of the colon was unsatisfactory.                           - Two 2 to 3 mm polyps in the proximal transverse                            colon and in the ascending colon.                           - Diverticulosis in the left colon and in the right                            colon.                           - Internal hemorrhoids.                           - The examination was otherwise normal on direct                            and retroflexion views.                           - No specimens  collected. Recommendation:           - Repeat colonoscopy in 3 years for surveillance...                            WILL NEED MORE EXTENSIVE PREP.                           - Patient has a contact number available for                            emergencies. The signs and symptoms of potential                            delayed complications were discussed with the                            patient. Return to normal activities tomorrow.  Written discharge instructions were provided to the                            patient.                           - Resume previous diet.                           - Continue present medications.                           - Await pathology results. Docia Chuck. Henrene Pastor, MD 12/19/2016 9:53:05 AM This report has been signed electronically.

## 2016-12-19 NOTE — Patient Instructions (Signed)
**   Handouts given on polyps, diverticulosis, and hemorrhoids **   YOU HAD AN ENDOSCOPIC PROCEDURE TODAY AT THE [facility]:   Refer to the procedure report that was given to you for any specific questions about what was found during the examination.  If the procedure report does not answer your questions, please call your gastroenterologist to clarify.  If you requested that your care partner not be given the details of your procedure findings, then the procedure report has been included in a sealed envelope for you to review at your convenience later.  YOU SHOULD EXPECT: Some feelings of bloating in the abdomen. Passage of more gas than usual.  Walking can help get rid of the air that was put into your GI tract during the procedure and reduce the bloating. If you had a lower endoscopy (such as a colonoscopy or flexible sigmoidoscopy) you may notice spotting of blood in your stool or on the toilet paper. If you underwent a bowel prep for your procedure, you may not have a normal bowel movement for a few days.  Please Note:  You might notice some irritation and congestion in your nose or some drainage.  This is from the oxygen used during your procedure.  There is no need for concern and it should clear up in a day or so.  SYMPTOMS TO REPORT IMMEDIATELY:   Following lower endoscopy (colonoscopy or flexible sigmoidoscopy):  Excessive amounts of blood in the stool  Significant tenderness or worsening of abdominal pains  Swelling of the abdomen that is new, acute  Fever of 100F or higher  For urgent or emergent issues, a gastroenterologist can be reached at any hour by calling [phone].   DIET:  We do recommend a small meal at first, but then you may proceed to your regular diet.  Drink plenty of fluids but you should avoid alcoholic beverages for 24 hours.  ACTIVITY:  You should plan to take it easy for the rest of today and you should NOT DRIVE or use heavy machinery until  tomorrow (because of the sedation medicines used during the test).    FOLLOW UP: Our staff will call the number listed on your records the next business day following your procedure to check on you and address any questions or concerns that you may have regarding the information given to you following your procedure. If we do not reach you, we will leave a message.  However, if you are feeling well and you are not experiencing any problems, there is no need to return our call.  We will assume that you have returned to your regular daily activities without incident.  If any biopsies were taken you will be contacted by phone or by letter within the next 1-3 weeks.  Please call us at [phone] if you have not heard about the biopsies in 3 weeks.    SIGNATURES/CONFIDENTIALITY: You and/or your care partner have signed paperwork which will be entered into your electronic medical record.  These signatures attest to the fact that that the information above on your After Visit Summary has been reviewed and is understood.  Full responsibility of the confidentiality of this discharge information lies with you and/or your care-partner. 

## 2016-12-19 NOTE — Progress Notes (Signed)
Spontaneous respirations throughout. VSS. Resting comfortably. To PACU on room air. Report to  RN. 

## 2016-12-19 NOTE — Progress Notes (Signed)
Pt's states no medical or surgical changes since previsit or office visit. 

## 2016-12-20 ENCOUNTER — Telehealth: Payer: Self-pay | Admitting: *Deleted

## 2016-12-20 NOTE — Telephone Encounter (Signed)
  Follow up Call-  Call back number 12/19/2016  Post procedure Call Back phone  # [phone]  Permission to leave phone message Yes  Some recent data might be hidden     Patient questions:  Do you have a fever, pain , or abdominal swelling? No. Pain Score  0 *  Have you tolerated food without any problems? Yes.    Have you been able to return to your normal activities? Yes.    Do you have any questions about your discharge instructions: Diet   No. Medications  No. Follow up visit  No.  Do you have questions or concerns about your Care? No.  Actions: * If pain score is 4 or above: No action needed, pain <4.

## 2016-12-25 ENCOUNTER — Encounter: Payer: Self-pay | Admitting: Internal Medicine

## 2017-02-04 ENCOUNTER — Other Ambulatory Visit: Payer: Self-pay | Admitting: Internal Medicine

## 2017-03-10 DIAGNOSIS — H8110 Benign paroxysmal vertigo, unspecified ear: Secondary | ICD-10-CM | POA: Diagnosis not present

## 2017-04-24 ENCOUNTER — Other Ambulatory Visit: Payer: Self-pay

## 2017-04-24 ENCOUNTER — Encounter: Payer: Self-pay | Admitting: Internal Medicine

## 2017-04-24 ENCOUNTER — Ambulatory Visit (INDEPENDENT_AMBULATORY_CARE_PROVIDER_SITE_OTHER): Payer: BLUE CROSS/BLUE SHIELD | Admitting: Internal Medicine

## 2017-04-24 VITALS — BP 120/82 | HR 80 | Temp 98.1°F | Ht 68.0 in | Wt 221.0 lb

## 2017-04-24 DIAGNOSIS — Z0000 Encounter for general adult medical examination without abnormal findings: Secondary | ICD-10-CM

## 2017-04-24 DIAGNOSIS — E119 Type 2 diabetes mellitus without complications: Secondary | ICD-10-CM

## 2017-04-24 DIAGNOSIS — Z125 Encounter for screening for malignant neoplasm of prostate: Secondary | ICD-10-CM | POA: Diagnosis not present

## 2017-04-24 DIAGNOSIS — Z23 Encounter for immunization: Secondary | ICD-10-CM

## 2017-04-24 LAB — CBC WITH DIFFERENTIAL/PLATELET
BASOS PCT: 0.6 % (ref 0.0–3.0)
Basophils Absolute: 0 10*3/uL (ref 0.0–0.1)
EOS PCT: 3.2 % (ref 0.0–5.0)
Eosinophils Absolute: 0.1 10*3/uL (ref 0.0–0.7)
HCT: 41.1 % (ref 39.0–52.0)
Hemoglobin: 13.9 g/dL (ref 13.0–17.0)
LYMPHS ABS: 2 10*3/uL (ref 0.7–4.0)
Lymphocytes Relative: 45.9 % (ref 12.0–46.0)
MCHC: 33.8 g/dL (ref 30.0–36.0)
MCV: 84.4 fl (ref 78.0–100.0)
MONO ABS: 0.5 10*3/uL (ref 0.1–1.0)
Monocytes Relative: 12.6 % — ABNORMAL HIGH (ref 3.0–12.0)
NEUTROS ABS: 1.6 10*3/uL (ref 1.4–7.7)
NEUTROS PCT: 37.7 % — AB (ref 43.0–77.0)
PLATELETS: 379 10*3/uL (ref 150.0–400.0)
RBC: 4.87 Mil/uL (ref 4.22–5.81)
RDW: 14.6 % (ref 11.5–15.5)
WBC: 4.3 10*3/uL (ref 4.0–10.5)

## 2017-04-24 LAB — LIPID PANEL
CHOLESTEROL: 152 mg/dL (ref 0–200)
HDL: 28.6 mg/dL — AB (ref >39.00)
LDL Cholesterol: 109 mg/dL — ABNORMAL HIGH (ref 0–99)
NonHDL: 123.53
Total CHOL/HDL Ratio: 5
Triglycerides: 72 mg/dL (ref 0.0–149.0)
VLDL: 14.4 mg/dL (ref 0.0–40.0)

## 2017-04-24 LAB — COMPREHENSIVE METABOLIC PANEL
ALBUMIN: 4.5 g/dL (ref 3.5–5.2)
ALK PHOS: 52 U/L (ref 39–117)
ALT: 11 U/L (ref 0–53)
AST: 20 U/L (ref 0–37)
BILIRUBIN TOTAL: 0.5 mg/dL (ref 0.2–1.2)
BUN: 12 mg/dL (ref 6–23)
CO2: 33 mEq/L — ABNORMAL HIGH (ref 19–32)
Calcium: 9.6 mg/dL (ref 8.4–10.5)
Chloride: 101 mEq/L (ref 96–112)
Creatinine, Ser: 0.86 mg/dL (ref 0.40–1.50)
GFR: 116.35 mL/min (ref >60.00)
Glucose, Bld: 115 mg/dL — ABNORMAL HIGH (ref 70–99)
POTASSIUM: 3.9 meq/L (ref 3.5–5.1)
SODIUM: 140 meq/L (ref 135–145)
TOTAL PROTEIN: 7.6 g/dL (ref 6.0–8.3)

## 2017-04-24 LAB — PSA: PSA: 0.04 ng/mL — AB (ref 0.10–4.00)

## 2017-04-24 LAB — MICROALBUMIN / CREATININE URINE RATIO
Creatinine,U: 115.1 mg/dL
MICROALB UR: 2.4 mg/dL — AB (ref 0.0–1.9)
Microalb Creat Ratio: 2.1 mg/g (ref 0.0–30.0)

## 2017-04-24 LAB — POCT GLYCOSYLATED HEMOGLOBIN (HGB A1C): HEMOGLOBIN A1C: 6.6

## 2017-04-24 LAB — TSH: TSH: 1.05 u[IU]/mL (ref 0.35–4.50)

## 2017-04-24 MED ORDER — CLOTRIMAZOLE 1 % EX CREA: 1.0000 "application " | TOPICAL_CREAM | Freq: Two times a day (BID) | CUTANEOUS | 0 refills | Status: DC

## 2017-04-24 NOTE — Progress Notes (Signed)
Subjective:    Patient ID: Kenneth [name], male    DOB: 07/02/1955, 62 y.o.   MRN: [id number]  HPI  62 year old patient who is seen today for a preventive health examination He has a history of type 2 diabetes.  Which has been well controlled.  He has essential hypertension and dyslipidemia. He had a follow-up colonoscopy this past fall that revealed 2 small adenomatous polyps.  He now will be on a 5-year follow-up plan. He has no concerns or complaints except for a intertriginous rash in the groin area. He has a history of gout.  He underwent laparoscopic ventral hernia repair in 2012 He has had a remote prostatectomy in 2010  Past Medical History:  Diagnosis Date  . Allergy   . Arthritis    ?knees,hips   . Cancer [facility])    prostate- robotic surgery, 2010.  . Diabetes mellitus    diagnosed 2008  . Hemorrhoids    h/o  . Hypercholesteremia   . Hypertension      Social History   Socioeconomic History  . Marital status: Married    Spouse name: Not on file  . Number of children: Not on file  . Years of education: Not on file  . Highest education level: Not on file  Social Needs  . Financial resource strain: Not on file  . Food insecurity - worry: Not on file  . Food insecurity - inability: Not on file  . Transportation needs - medical: Not on file  . Transportation needs - non-medical: Not on file  Occupational History  . Not on file  Tobacco Use  . Smoking status: Never Smoker  . Smokeless tobacco: Never Used  Substance and Sexual Activity  . Alcohol use: Yes    Alcohol/week: 4.2 oz    Types: 7 Glasses of wine per week  . Drug use: No  . Sexual activity: No  Other Topics Concern  . Not on file  Social History Narrative  . Not on file    Past Surgical History:  Procedure Laterality Date  . ABDOMINAL HERNIA REPAIR  02/22/11  . HERNIA REPAIR  02/22/11   vental hernia  . PROSTATECTOMY  October 15th, 2010  . VENTRAL HERNIA REPAIR  02/22/2011   Procedure:  LAPAROSCOPIC VENTRAL HERNIA;  Surgeon: Merrie Roof, MD;  Location: [place];  Service: General;  Laterality: N/A;  laparoscopic ventral hernia repair with mesh    Family History  Problem Relation Age of Onset  . Hypertension Mother   . Diabetes Father   . Hypertension Father   . Anesthesia problems Neg Hx   . Hypotension Neg Hx   . Malignant hyperthermia Neg Hx   . Pseudochol deficiency Neg Hx   . Colon cancer Neg Hx     No Known Allergies  Current Outpatient Medications on File Prior to Visit  Medication Sig Dispense Refill  . amLODipine (NORVASC) 10 MG tablet TAKE 1 TABLET BY MOUTH  DAILY 90 tablet 3  . atorvastatin (LIPITOR) 40 MG tablet TAKE 1 TABLET BY MOUTH  DAILY 90 tablet 1  . benazepril-hydrochlorthiazide (LOTENSIN HCT) 20-25 MG tablet TAKE 1 TABLET BY MOUTH  DAILY 90 tablet 3  . cetirizine (ZYRTEC) 10 MG tablet Take 10 mg by mouth daily.    . fluticasone (FLONASE) 50 MCG/ACT nasal spray Place 2 sprays into both nostrils daily. 48 g 3  . metFORMIN (GLUCOPHAGE) 1000 MG tablet TAKE 1 TABLET BY MOUTH TWO  TIMES DAILY WITH A MEAL  180 tablet 0  . OVER THE COUNTER MEDICATION Take 1 tablet by mouth daily. Mega Red Joint    . potassium chloride SA (K-DUR,KLOR-CON) 20 MEQ tablet TAKE 1 TABLET BY MOUTH  DAILY 90 tablet 3   No current facility-administered medications on file prior to visit.     BP 120/82 (BP Location: Left Arm, Patient Position: Sitting, Cuff Size: Large)   Pulse 80   Temp 98.1 F (36.7 C) (Oral)   Ht 5\' 8"  (1.727 m)   Wt 221 lb (100.2 kg)   SpO2 97%   BMI 33.60 kg/m      Review of Systems  Constitutional: Negative for appetite change, chills, fatigue and fever.  HENT: Negative for congestion, dental problem, ear pain, hearing loss, sore throat, tinnitus, trouble swallowing and voice change.   Eyes: Negative for pain, discharge and visual disturbance.  Respiratory: Negative for cough, chest tightness, wheezing and stridor.   Cardiovascular: Negative  for chest pain, palpitations and leg swelling.  Gastrointestinal: Negative for abdominal distention, abdominal pain, blood in stool, constipation, diarrhea, nausea and vomiting.  Genitourinary: Negative for difficulty urinating, discharge, flank pain, genital sores, hematuria and urgency.  Musculoskeletal: Negative for arthralgias, back pain, gait problem, joint swelling, myalgias and neck stiffness.  Skin: Positive for rash.  Neurological: Negative for dizziness, syncope, speech difficulty, weakness, numbness and headaches.  Hematological: Negative for adenopathy. Does not bruise/bleed easily.  Psychiatric/Behavioral: Negative for behavioral problems and dysphoric mood. The patient is not nervous/anxious.        Objective:   Physical Exam  Constitutional: He appears well-developed and well-nourished.  Blood pressure 120/78 Weight 220    HENT:  Head: Normocephalic and atraumatic.  Right Ear: External ear normal.  Left Ear: External ear normal.  Nose: Nose normal.  Mouth/Throat: Oropharynx is clear and moist.  Eyes: Conjunctivae and EOM are normal. Pupils are equal, round, and reactive to light. No scleral icterus.  Neck: Normal range of motion. Neck supple. No JVD present. No thyromegaly present.  Cardiovascular: Regular rhythm, normal heart sounds and intact distal pulses. Exam reveals no gallop and no friction rub.  No murmur heard. Pulmonary/Chest: Effort normal and breath sounds normal. He exhibits no tenderness.  Abdominal: Soft. Bowel sounds are normal. He exhibits no distension and no mass. There is no tenderness.  Genitourinary: Penis normal.  Genitourinary Comments: Uncircumcised  Musculoskeletal: Normal range of motion. He exhibits no edema or tenderness.  Lymphadenopathy:    He has no cervical adenopathy.  Neurological: He is alert. He has normal reflexes. No cranial nerve deficit. Coordination normal.  Skin: Skin is warm and dry. No rash noted.  Intertriginous rash of  the groin area left greater than right  Psychiatric: He has a normal mood and affect. His behavior is normal.          Assessment & Plan:   Preventive health examination Diabetes mellitus Dyslipidemia will review a lipid profile Essential hypertension stable Intertriginous groin rash.  Will treat with Lotrimin Mild obesity weight loss encouraged  Health maintenance flu vaccine and Pneumovax administered  Follow-up 4 months  [id number],PETER Pilar Plate

## 2017-04-24 NOTE — Addendum Note (Signed)
Addended by: Gwenyth Ober R on: 04/24/2017 12:05 PM   Modules accepted: Orders

## 2017-04-24 NOTE — Patient Instructions (Signed)
Limit your sodium (Salt) intake    It is important that you exercise regularly, at least 20 minutes 3 to 4 times per week.  If you develop chest pain or shortness of breath seek  medical attention.   Please check your hemoglobin A1c every 3-6  Months  Please check your blood pressure on a regular basis.  If it is consistently greater than 150/90, please make an office appointment.  You need to lose weight.  Consider a lower calorie diet and regular exercise.

## 2017-04-25 LAB — HEPATITIS C ANTIBODY
Hepatitis C Ab: NONREACTIVE
SIGNAL TO CUT-OFF: 0.01 (ref <1.00)

## 2017-05-31 ENCOUNTER — Other Ambulatory Visit: Payer: Self-pay | Admitting: Internal Medicine

## 2017-07-02 DIAGNOSIS — H2513 Age-related nuclear cataract, bilateral: Secondary | ICD-10-CM | POA: Diagnosis not present

## 2017-07-02 DIAGNOSIS — H40003 Preglaucoma, unspecified, bilateral: Secondary | ICD-10-CM | POA: Diagnosis not present

## 2017-07-02 DIAGNOSIS — E119 Type 2 diabetes mellitus without complications: Secondary | ICD-10-CM | POA: Diagnosis not present

## 2017-10-22 ENCOUNTER — Encounter: Payer: Self-pay | Admitting: Internal Medicine

## 2017-10-22 ENCOUNTER — Ambulatory Visit (INDEPENDENT_AMBULATORY_CARE_PROVIDER_SITE_OTHER): Payer: BLUE CROSS/BLUE SHIELD | Admitting: Internal Medicine

## 2017-10-22 VITALS — BP 110/76 | HR 84 | Temp 98.0°F | Wt 236.0 lb

## 2017-10-22 DIAGNOSIS — I10 Essential (primary) hypertension: Secondary | ICD-10-CM

## 2017-10-22 DIAGNOSIS — E119 Type 2 diabetes mellitus without complications: Secondary | ICD-10-CM

## 2017-10-22 LAB — POCT GLYCOSYLATED HEMOGLOBIN (HGB A1C): Hemoglobin A1C: 7.1 % — AB (ref 4.0–5.6)

## 2017-10-22 MED ORDER — METFORMIN HCL 1000 MG PO TABS: ORAL_TABLET | ORAL | 4 refills | Status: DC

## 2017-10-22 MED ORDER — BENAZEPRIL-HYDROCHLOROTHIAZIDE 20-25 MG PO TABS: 1.0000 | ORAL_TABLET | Freq: Every day | ORAL | 3 refills | Status: DC

## 2017-10-22 MED ORDER — AMLODIPINE BESYLATE 10 MG PO TABS: 10.0000 mg | ORAL_TABLET | Freq: Every day | ORAL | 3 refills | Status: DC

## 2017-10-22 MED ORDER — POTASSIUM CHLORIDE CRYS ER 20 MEQ PO TBCR: 20.0000 meq | EXTENDED_RELEASE_TABLET | Freq: Every day | ORAL | 3 refills | Status: DC

## 2017-10-22 MED ORDER — ATORVASTATIN CALCIUM 40 MG PO TABS: 40.0000 mg | ORAL_TABLET | Freq: Every day | ORAL | 1 refills | Status: DC

## 2017-10-22 MED ORDER — CETIRIZINE HCL 10 MG PO TABS: 10.0000 mg | ORAL_TABLET | Freq: Every day | ORAL | 3 refills | Status: DC

## 2017-10-22 NOTE — Progress Notes (Signed)
Subjective:    Patient ID: Kenneth [name], male    DOB: [birth year], 62 y.o.   MRN: [id number]  HPI  62 year old patient who has a history of essential hypertension.  He is seen today also for follow-up of type 2 diabetes. For the past few weeks he has had some intermittent vertigo.  At times this is quite severe with a spinning sensation associated with nausea and vomiting. He has a history of dyslipidemia and remains on statin therapy.  Lab Results  Component Value Date   HGBA1C 7.1 (A) 10/22/2017    Past Medical History:  Diagnosis Date  . Allergy   . Arthritis    ?knees,hips   . Cancer [facility])    prostate- robotic surgery, 2010.  . Diabetes mellitus    diagnosed 2008  . Hemorrhoids    h/o  . Hypercholesteremia   . Hypertension      Social History   Socioeconomic History  . Marital status: Married    Spouse name: Not on file  . Number of children: Not on file  . Years of education: Not on file  . Highest education level: Not on file  Occupational History  . Not on file  Social Needs  . Financial resource strain: Not on file  . Food insecurity:    Worry: Not on file    Inability: Not on file  . Transportation needs:    Medical: Not on file    Non-medical: Not on file  Tobacco Use  . Smoking status: Never Smoker  . Smokeless tobacco: Never Used  Substance and Sexual Activity  . Alcohol use: Yes    Alcohol/week: 7.0 standard drinks    Types: 7 Glasses of wine per week  . Drug use: No  . Sexual activity: Never  Lifestyle  . Physical activity:    Days per week: Not on file    Minutes per session: Not on file  . Stress: Not on file  Relationships  . Social connections:    Talks on phone: Not on file    Gets together: Not on file    Attends religious service: Not on file    Active member of club or organization: Not on file    Attends meetings of clubs or organizations: Not on file    Relationship status: Not on file  . Intimate partner violence:   Fear of current or ex partner: Not on file    Emotionally abused: Not on file    Physically abused: Not on file    Forced sexual activity: Not on file  Other Topics Concern  . Not on file  Social History Narrative  . Not on file    Past Surgical History:  Procedure Laterality Date  . ABDOMINAL HERNIA REPAIR  02/22/11  . HERNIA REPAIR  02/22/11   vental hernia  . PROSTATECTOMY  October 15th, 2010  . VENTRAL HERNIA REPAIR  02/22/2011   Procedure: LAPAROSCOPIC VENTRAL HERNIA;  Surgeon: Merrie Roof, MD;  Location: [place];  Service: General;  Laterality: N/A;  laparoscopic ventral hernia repair with mesh    Family History  Problem Relation Age of Onset  . Hypertension Mother   . Diabetes Father   . Hypertension Father   . Anesthesia problems Neg Hx   . Hypotension Neg Hx   . Malignant hyperthermia Neg Hx   . Pseudochol deficiency Neg Hx   . Colon cancer Neg Hx     No Known Allergies  Current Outpatient  Medications on File Prior to Visit  Medication Sig Dispense Refill  . clotrimazole (LOTRIMIN) 1 % cream Apply 1 application topically 2 (two) times daily. 30 g 0  . fluticasone (FLONASE) 50 MCG/ACT nasal spray Place 2 sprays into both nostrils daily. 48 g 3  . OVER THE COUNTER MEDICATION Take 1 tablet by mouth daily. Mega Red Joint     No current facility-administered medications on file prior to visit.     BP 110/76 (BP Location: Right Arm, Patient Position: Sitting, Cuff Size: Large)   Pulse 84   Temp 98 F (36.7 C) (Oral)   Wt 236 lb (107 kg)   SpO2 95%   BMI 35.88 kg/m     Review of Systems  Constitutional: Negative for appetite change, chills, fatigue and fever.  HENT: Negative for congestion, dental problem, ear pain, hearing loss, sore throat, tinnitus, trouble swallowing and voice change.   Eyes: Negative for pain, discharge and visual disturbance.  Respiratory: Negative for cough, chest tightness, wheezing and stridor.   Cardiovascular: Negative for  chest pain, palpitations and leg swelling.  Gastrointestinal: Positive for nausea and vomiting. Negative for abdominal distention, abdominal pain, blood in stool, constipation and diarrhea.  Genitourinary: Negative for difficulty urinating, discharge, flank pain, genital sores, hematuria and urgency.  Musculoskeletal: Negative for arthralgias, back pain, gait problem, joint swelling, myalgias and neck stiffness.  Skin: Negative for rash.  Neurological: Positive for dizziness and light-headedness. Negative for syncope, speech difficulty, weakness, numbness and headaches.  Hematological: Negative for adenopathy. Does not bruise/bleed easily.  Psychiatric/Behavioral: Negative for behavioral problems and dysphoric mood. The patient is not nervous/anxious.        Objective:   Physical Exam  Constitutional: He is oriented to person, place, and time. He appears well-developed.  Weight 236 Blood pressure well controlled  HENT:  Head: Normocephalic.  Right Ear: External ear normal.  Left Ear: External ear normal.  Eyes: Conjunctivae and EOM are normal.  Neck: Normal range of motion.  Cardiovascular: Normal rate and normal heart sounds.  Pulmonary/Chest: Breath sounds normal.  Abdominal: Bowel sounds are normal.  Musculoskeletal: Normal range of motion. He exhibits no edema or tenderness.  Neurological: He is alert and oriented to person, place, and time.  Extraocular muscles full Pupillary responses normal Normal finger-to-nose testing  Psychiatric: He has a normal mood and affect. His behavior is normal.          Assessment & Plan:   Hypertension well-controlled Diabetes mellitus.  Hemoglobin A1c has increased to 7.1.  Options discussed.  Have elected to intensify lifestyle issues.  Follow-up in globin A1c in 3 to 4 months.  Will consider additional medication at that time if needed Benign positional vertigo.  Patient information dispensed including repositioning maneuvers.  Will  call if unimproved  Follow-up 3 to 4 months All medications refilled  Marletta Lor

## 2017-10-22 NOTE — Patient Instructions (Addendum)
Limit your sodium (Salt) intake   Please check your hemoglobin A1c every 3 months    It is important that you exercise regularly, at least 20 minutes 3 to 4 times per week.  If you develop chest pain or shortness of breath seek  medical attention.  You need to lose weight.  Consider a lower calorie diet and regular exercise.   Benign Positional Vertigo Vertigo is the feeling that you or your surroundings are moving when they are not. Benign positional vertigo is the most common form of vertigo. The cause of this condition is not serious (is benign). This condition is triggered by certain movements and positions (is positional). This condition can be dangerous if it occurs while you are doing something that could endanger you or others, such as driving. What are the causes? In many cases, the cause of this condition is not known. It may be caused by a disturbance in an area of the inner ear that helps your brain to sense movement and balance. This disturbance can be caused by a viral infection (labyrinthitis), head injury, or repetitive motion. What increases the risk? This condition is more likely to develop in:  Women.  People who are 32 years of age or older.  What are the signs or symptoms? Symptoms of this condition usually happen when you move your head or your eyes in different directions. Symptoms may start suddenly, and they usually last for less than a minute. Symptoms may include:  Loss of balance and falling.  Feeling like you are spinning or moving.  Feeling like your surroundings are spinning or moving.  Nausea and vomiting.  Blurred vision.  Dizziness.  Involuntary eye movement (nystagmus).  Symptoms can be mild and cause only slight annoyance, or they can be severe and interfere with daily life. Episodes of benign positional vertigo may return (recur) over time, and they may be triggered by certain movements. Symptoms may improve over time. How is this  diagnosed? This condition is usually diagnosed by medical history and a physical exam of the head, neck, and ears. You may be referred to a health care provider who specializes in ear, nose, and throat (ENT) problems (otolaryngologist) or a provider who specializes in disorders of the nervous system (neurologist). You may have additional testing, including:  MRI.  A CT scan.  Eye movement tests. Your health care provider may ask you to change positions quickly while he or she watches you for symptoms of benign positional vertigo, such as nystagmus. Eye movement may be tested with an electronystagmogram (ENG), caloric stimulation, the Dix-Hallpike test, or the roll test.  An electroencephalogram (EEG). This records electrical activity in your brain.  Hearing tests.  How is this treated? Usually, your health care provider will treat this by moving your head in specific positions to adjust your inner ear back to normal. Surgery may be needed in severe cases, but this is rare. In some cases, benign positional vertigo may resolve on its own in 2-4 weeks. Follow these instructions at home: Safety  Move slowly.Avoid sudden body or head movements.  Avoid driving.  Avoid operating heavy machinery.  Avoid doing any tasks that would be dangerous to you or others if a vertigo episode would occur.  If you have trouble walking or keeping your balance, try using a cane for stability. If you feel dizzy or unstable, sit down right away.  Return to your normal activities as told by your health care provider. Ask your health care provider  what activities are safe for you. General instructions  Take over-the-counter and prescription medicines only as told by your health care provider.  Avoid certain positions or movements as told by your health care provider.  Drink enough fluid to keep your urine clear or pale yellow.  Keep all follow-up visits as told by your health care provider. This is  important. Contact a health care provider if:  You have a fever.  Your condition gets worse or you develop new symptoms.  Your family or friends notice any behavioral changes.  Your nausea or vomiting gets worse.  You have numbness or a "pins and needles" sensation. Get help right away if:  You have difficulty speaking or moving.  You are always dizzy.  You faint.  You develop severe headaches.  You have weakness in your legs or arms.  You have changes in your hearing or vision.  You develop a stiff neck.  You develop sensitivity to light. This information is not intended to replace advice given to you by your health care provider. Make sure you discuss any questions you have with your health care provider. Document Released: 12/05/2005 Document Revised: 08/05/2015 Document Reviewed: 06/22/2014 Elsevier Interactive Patient Education  [employer].

## 2018-02-25 ENCOUNTER — Encounter: Payer: BLUE CROSS/BLUE SHIELD | Admitting: Family Medicine

## 2018-03-08 ENCOUNTER — Ambulatory Visit (INDEPENDENT_AMBULATORY_CARE_PROVIDER_SITE_OTHER): Payer: BLUE CROSS/BLUE SHIELD | Admitting: Family Medicine

## 2018-03-08 ENCOUNTER — Encounter: Payer: Self-pay | Admitting: Family Medicine

## 2018-03-08 VITALS — BP 120/82 | HR 86 | Temp 98.2°F | Ht 68.0 in | Wt 235.5 lb

## 2018-03-08 DIAGNOSIS — Z114 Encounter for screening for human immunodeficiency virus [HIV]: Secondary | ICD-10-CM

## 2018-03-08 DIAGNOSIS — I10 Essential (primary) hypertension: Secondary | ICD-10-CM | POA: Diagnosis not present

## 2018-03-08 DIAGNOSIS — L989 Disorder of the skin and subcutaneous tissue, unspecified: Secondary | ICD-10-CM | POA: Diagnosis not present

## 2018-03-08 DIAGNOSIS — E119 Type 2 diabetes mellitus without complications: Secondary | ICD-10-CM | POA: Diagnosis not present

## 2018-03-08 DIAGNOSIS — T464X5A Adverse effect of angiotensin-converting-enzyme inhibitors, initial encounter: Secondary | ICD-10-CM

## 2018-03-08 DIAGNOSIS — F4323 Adjustment disorder with mixed anxiety and depressed mood: Secondary | ICD-10-CM

## 2018-03-08 DIAGNOSIS — R05 Cough: Secondary | ICD-10-CM

## 2018-03-08 DIAGNOSIS — Z8546 Personal history of malignant neoplasm of prostate: Secondary | ICD-10-CM

## 2018-03-08 MED ORDER — FLUTICASONE PROPIONATE 50 MCG/ACT NA SUSP: 2.0000 | Freq: Every day | NASAL | 3 refills | Status: DC

## 2018-03-08 MED ORDER — LOSARTAN POTASSIUM-HCTZ 100-25 MG PO TABS
1.0000 | ORAL_TABLET | Freq: Every day | ORAL | 3 refills | Status: DC
Start: 2018-03-08 — End: 2018-11-19

## 2018-03-08 NOTE — Patient Instructions (Addendum)
Please consider counseling. Contact [phone] to schedule an appointment or inquire about cost/insurance coverage.  I wouldn't do anything about the skin lesion on your head unless it changes.  Keep the diet clean and stay active.  Let us know if you need anything.

## 2018-03-08 NOTE — Progress Notes (Signed)
Chief Complaint  Patient presents with  . New Patient (Initial Visit)    Subjective Kenneth [name] is a 62 y.o. male who presents for hypertension follow up. He does not monitor home blood pressures. He is compliant with medications- Lotensin, . Patient has these side effects of medication: cough (since starting medication) He is not adhering to a healthy diet overall. Current exercise: none currently   Situational anxiety from work, not interested in medicine. Interested in therapy. Does not follow with anyone. Feels he is being disrespected, planning on retiring at end of April.   Lesion on scalp. No changing, no itching or pain. He picks at it because it's there. Wife wanted him to get it checked out.   Past Medical History:  Diagnosis Date  . Allergy   . Arthritis    ?knees,hips   . Cancer [facility])    prostate- robotic surgery, 2010.  . Diabetes mellitus    diagnosed 2008  . Hemorrhoids    h/o  . Hypercholesteremia   . Hypertension     Review of Systems Cardiovascular: no chest pain Respiratory:  no shortness of breath  Exam BP 120/82 (BP Location: Left Arm, Patient Position: Sitting, Cuff Size: Large)   Pulse 86   Temp 98.2 F (36.8 C) (Oral)   Ht 5\' 8"  (1.727 m)   Wt 235 lb 8 oz (106.8 kg)   SpO2 97%   BMI 35.81 kg/m  General:  well developed, well nourished, in no apparent distress Heart: RRR, no bruits, no LE edema Lungs: clear to auscultation, no accessory muscle use Skin: See below Psych: well oriented with normal range of affect and appropriate judgment/insight      Essential hypertension - Plan: losartan-hydrochlorothiazide (HYZAAR) 100-25 MG tablet  Situational mixed anxiety and depressive disorder  Skin lesion  Diabetes mellitus without complication (HCC) - Plan: CBC, Comprehensive metabolic panel, Lipid panel, Hemoglobin A1c, Microalbumin / creatinine urine ratio  Screening for HIV (human immunodeficiency virus) - Plan: HIV Antibody  (routine testing w rflx)  History of prostate cancer - Plan: PSA  ACE-inhibitor cough - Plan: losartan-hydrochlorothiazide (HYZAAR) 100-25 MG tablet  Orders as above. Change Lotensin to Hyzaar.  Counseled on diet and exercise. [facility] # info provided.  Monitor skin lesion. He will let us know if there are changes.  F/u in 2 mo for CPE. The patient voiced understanding and agreement to the plan.  [place], DO 03/08/18  9:59 AM

## 2018-03-08 NOTE — Progress Notes (Signed)
Pre visit review using our clinic review tool, if applicable. No additional management support is needed unless otherwise documented below in the visit note. 

## 2018-04-30 ENCOUNTER — Other Ambulatory Visit (INDEPENDENT_AMBULATORY_CARE_PROVIDER_SITE_OTHER): Payer: BLUE CROSS/BLUE SHIELD

## 2018-04-30 DIAGNOSIS — E119 Type 2 diabetes mellitus without complications: Secondary | ICD-10-CM

## 2018-04-30 DIAGNOSIS — Z8546 Personal history of malignant neoplasm of prostate: Secondary | ICD-10-CM

## 2018-04-30 DIAGNOSIS — Z114 Encounter for screening for human immunodeficiency virus [HIV]: Secondary | ICD-10-CM

## 2018-04-30 LAB — COMPREHENSIVE METABOLIC PANEL
ALT: 18 U/L (ref 0–53)
AST: 30 U/L (ref 0–37)
Albumin: 4.6 g/dL (ref 3.5–5.2)
Alkaline Phosphatase: 62 U/L (ref 39–117)
BUN: 19 mg/dL (ref 6–23)
CO2: 31 meq/L (ref 19–32)
Calcium: 9.8 mg/dL (ref 8.4–10.5)
Chloride: 101 mEq/L (ref 96–112)
Creatinine, Ser: 1.23 mg/dL (ref 0.40–1.50)
GFR: 72.19 mL/min (ref >60.00)
Glucose, Bld: 155 mg/dL — ABNORMAL HIGH (ref 70–99)
Potassium: 4 mEq/L (ref 3.5–5.1)
Sodium: 142 mEq/L (ref 135–145)
Total Bilirubin: 0.5 mg/dL (ref 0.2–1.2)
Total Protein: 8 g/dL (ref 6.0–8.3)

## 2018-04-30 LAB — HEMOGLOBIN A1C: Hgb A1c MFr Bld: 8.3 % — ABNORMAL HIGH (ref 4.6–6.5)

## 2018-04-30 LAB — LIPID PANEL
Cholesterol: 158 mg/dL (ref 0–200)
HDL: 37.7 mg/dL — ABNORMAL LOW (ref >39.00)
LDL Cholesterol: 99 mg/dL (ref 0–99)
NONHDL: 120.47
Total CHOL/HDL Ratio: 4
Triglycerides: 105 mg/dL (ref 0.0–149.0)
VLDL: 21 mg/dL (ref 0.0–40.0)

## 2018-04-30 LAB — MICROALBUMIN / CREATININE URINE RATIO
Creatinine,U: 131.8 mg/dL
MICROALB UR: 2.1 mg/dL — AB (ref 0.0–1.9)
Microalb Creat Ratio: 1.6 mg/g (ref 0.0–30.0)

## 2018-04-30 LAB — CBC
HCT: 42.9 % (ref 39.0–52.0)
Hemoglobin: 14.4 g/dL (ref 13.0–17.0)
MCHC: 33.6 g/dL (ref 30.0–36.0)
MCV: 83.9 fl (ref 78.0–100.0)
Platelets: 268 10*3/uL (ref 150.0–400.0)
RBC: 5.12 Mil/uL (ref 4.22–5.81)
RDW: 14.7 % (ref 11.5–15.5)
WBC: 4 10*3/uL (ref 4.0–10.5)

## 2018-04-30 LAB — PSA: PSA: 0.07 ng/mL — AB (ref 0.10–4.00)

## 2018-05-01 LAB — HIV ANTIBODY (ROUTINE TESTING W REFLEX): HIV 1&2 Ab, 4th Generation: NONREACTIVE

## 2018-05-02 ENCOUNTER — Other Ambulatory Visit: Payer: BLUE CROSS/BLUE SHIELD

## 2018-05-09 ENCOUNTER — Ambulatory Visit (INDEPENDENT_AMBULATORY_CARE_PROVIDER_SITE_OTHER): Payer: BLUE CROSS/BLUE SHIELD | Admitting: Family Medicine

## 2018-05-09 ENCOUNTER — Encounter: Payer: Self-pay | Admitting: Family Medicine

## 2018-05-09 VITALS — BP 108/86 | HR 88 | Temp 98.2°F | Ht 68.0 in | Wt 233.1 lb

## 2018-05-09 DIAGNOSIS — Z0000 Encounter for general adult medical examination without abnormal findings: Secondary | ICD-10-CM | POA: Diagnosis not present

## 2018-05-09 DIAGNOSIS — E119 Type 2 diabetes mellitus without complications: Secondary | ICD-10-CM

## 2018-05-09 NOTE — Progress Notes (Signed)
Chief Complaint  Patient presents with  . Annual Exam    Well Male Kenneth [name] is here for a complete physical.   His last physical was >1 year ago.  Current diet: in general, a "not that good" diet.  Current exercise: none currently Weight trend: stable Daytime fatigue? No. Seat belt? Yes.    Health maintenance Shingrix- No Colonoscopy- Yes Tetanus- Yes HIV- Yes Hep C- Yes   Past Medical History:  Diagnosis Date  . Allergy   . Arthritis    ?knees,hips   . Cancer [facility])    prostate- robotic surgery, 2010.  . Diabetes mellitus    diagnosed 2008  . Hemorrhoids    h/o  . Hypercholesteremia   . Hypertension       Past Surgical History:  Procedure Laterality Date  . ABDOMINAL HERNIA REPAIR  02/22/11  . HERNIA REPAIR  02/22/11   vental hernia  . PROSTATECTOMY  October 15th, 2010  . VENTRAL HERNIA REPAIR  02/22/2011   Procedure: LAPAROSCOPIC VENTRAL HERNIA;  Surgeon: Merrie Roof, MD;  Location: [place];  Service: General;  Laterality: N/A;  laparoscopic ventral hernia repair with mesh    Medications  Current Outpatient Medications on File Prior to Visit  Medication Sig Dispense Refill  . amLODipine (NORVASC) 10 MG tablet Take 1 tablet (10 mg total) by mouth daily. 90 tablet 3  . atorvastatin (LIPITOR) 40 MG tablet Take 1 tablet (40 mg total) by mouth daily. 90 tablet 1  . cetirizine (ZYRTEC) 10 MG tablet Take 1 tablet (10 mg total) by mouth daily. 60 tablet 3  . clotrimazole (LOTRIMIN) 1 % cream Apply 1 application topically 2 (two) times daily. 30 g 0  . fluticasone (FLONASE) 50 MCG/ACT nasal spray Place 2 sprays into both nostrils daily. 48 g 3  . losartan-hydrochlorothiazide (HYZAAR) 100-25 MG tablet Take 1 tablet by mouth daily. 90 tablet 3  . metFORMIN (GLUCOPHAGE) 1000 MG tablet TAKE 1 TABLET BY MOUTH TWO  TIMES DAILY WITH A MEAL 180 tablet 4  . OVER THE COUNTER MEDICATION Take 1 tablet by mouth daily. Mega Red Joint    . potassium chloride SA  (K-DUR,KLOR-CON) 20 MEQ tablet Take 1 tablet (20 mEq total) by mouth daily. 90 tablet 3   Allergies No Known Allergies  Family History Family History  Problem Relation Age of Onset  . Hypertension Mother   . Diabetes Father   . Hypertension Father   . Anesthesia problems Neg Hx   . Hypotension Neg Hx   . Malignant hyperthermia Neg Hx   . Pseudochol deficiency Neg Hx   . Colon cancer Neg Hx     Review of Systems: Constitutional:  no fevers Eye:  no recent significant change in vision Ear/Nose/Mouth/Throat:  Ears:  no hearing loss Nose/Mouth/Throat:  +nasal congestion, no sore throat Cardiovascular:  no chest pain, no palpitations Respiratory:  no cough and no shortness of breath Gastrointestinal:  no abdominal pain, no change in bowel habits GU:  Male: negative for dysuria, frequency, and incontinence and negative for prostate symptoms Musculoskeletal/Extremities:  no pain, redness, or swelling of the joints Integumentary (Skin/Breast):  no abnormal skin lesions reported Neurologic:  no headaches Endocrine: No unexpected weight changes Hematologic/Lymphatic:  no abnormal bleeding  Exam BP 108/86 (BP Location: Left Arm, Patient Position: Sitting, Cuff Size: Large)   Pulse 88   Temp 98.2 F (36.8 C) (Oral)   Ht 5\' 8"  (1.727 m)   Wt 233 lb 2 oz (105.7  kg)   SpO2 98%   BMI 35.45 kg/m  General:  well developed, well nourished, in no apparent distress Skin:  no significant moles, warts, or growths Head:  no masses, lesions, or tenderness Eyes:  pupils equal and round, sclera anicteric without injection Ears:  canals without lesions, TMs shiny without retraction, no obvious effusion, no erythema Nose:  nares patent, septum midline, mucosa normal Throat/Pharynx:  lips and gingiva without lesion; tongue and uvula midline; non-inflamed pharynx; no exudates or postnasal drainage Neck: neck supple without adenopathy, thyromegaly, or masses Lungs:  clear to auscultation, breath  sounds equal bilaterally, no respiratory distress Cardio:  regular rate and rhythm, no LE edema, no bruits Abdomen:  abdomen soft, nontender; bowel sounds normal; no masses or organomegaly Genital (male): Uncircumcised penis, no lesions or discharge; testes present bilaterally without masses or tenderness Rectal: Deferred Musculoskeletal:  symmetrical muscle groups noted without atrophy or deformity Extremities:  no clubbing, cyanosis, or edema, no deformities, no skin discoloration Neuro:  gait normal; deep tendon reflexes normal and symmetric Psych: well oriented with normal range of affect and appropriate judgment/insight  Assessment and Plan  Well adult exam  Diabetes mellitus without complication ([place]) - Plan: HM DIABETES FOOT EXAM   Well 63 y.o. male. Counseled on diet and exercise. He retires in 2 mo. Will get Shingrix after that. Immunizations, labs, and further orders as above. Offered meds for DM, prefers lifestyle mods first.  Has been seeing counseling who wants to take him out of work. I will reach out to her. Follow up in 3 mo. The patient voiced understanding and agreement to the plan.  [place], DO 05/09/18 8:19 AM

## 2018-05-09 NOTE — Patient Instructions (Addendum)
Claritin (loratadine), Allegra (fexofenadine), Zyrtec (cetirizine); these are listed in order from weakest to strongest. Generic, and therefore cheaper, options are in the parentheses.   Flonase (fluticasone); nasal spray that is over the counter. 2 sprays each nostril, once daily. Aim towards the same side eye when you spray.  There are available OTC, and the generic versions, which may be cheaper, are in parentheses. Show this to a pharmacist if you have trouble finding any of these items.  Goal weight for 3 months: 225 lbs  Keep the diet clean and stay active.

## 2018-05-16 ENCOUNTER — Encounter: Payer: Self-pay | Admitting: Family Medicine

## 2018-08-07 ENCOUNTER — Ambulatory Visit (INDEPENDENT_AMBULATORY_CARE_PROVIDER_SITE_OTHER): Payer: BLUE CROSS/BLUE SHIELD | Admitting: Family Medicine

## 2018-08-07 ENCOUNTER — Other Ambulatory Visit: Payer: Self-pay

## 2018-08-07 ENCOUNTER — Encounter: Payer: Self-pay | Admitting: Family Medicine

## 2018-08-07 DIAGNOSIS — E119 Type 2 diabetes mellitus without complications: Secondary | ICD-10-CM

## 2018-08-07 NOTE — Progress Notes (Signed)
Subjective:   Chief Complaint  Patient presents with  . Follow-up   Kenneth [name] is a 63 y.o. male here for follow-up of diabetes.  Due to COVID-19 pandemic, we are interacting via telephone. I verified patient's ID using 2 identifiers. Patient agreed to proceed with visit via this method. Patient is at home, I am at office. Patient and I are present for visit.   [name] does not monitor his home sugars. Patient does not require insulin.   Medications include: Metformin 1 g bid; doing well, compliant w/o AE's.  Last A1c was 8.3. Diet is better, lost 5-7 lbs. Exercise: walking more  Past Medical History:  Diagnosis Date  . Allergy   . Arthritis    ?knees,hips   . Cancer [facility])    prostate- robotic surgery, 2010.  . Diabetes mellitus    diagnosed 2008  . Hemorrhoids    h/o  . Hypercholesteremia   . Hypertension      Related testing: Date of retinal exam: Done Pneumovax: done  Flu Shot: done  Review of Systems: Pulmonary:  No SOB Cardiovascular:  No chest pain  Objective:  No conversational dyspnea Age appropriate judgment and insight Nml affect and mood  Assessment:   Diabetes mellitus without complication ([place]) - Plan: Hemoglobin A1c   Plan:   Orders as above. Cont Metformin. Will see what his A1c is. Based on his ins, will add Actos if not controlled.  Counseled on diet and exercise. Total time spent: 11 min F/u in 3-6 mo pending above. The patient voiced understanding and agreement to the plan.  [place], DO 08/07/18 8:39 AM

## 2018-08-13 ENCOUNTER — Other Ambulatory Visit: Payer: Self-pay

## 2018-08-13 ENCOUNTER — Other Ambulatory Visit (INDEPENDENT_AMBULATORY_CARE_PROVIDER_SITE_OTHER): Payer: BLUE CROSS/BLUE SHIELD

## 2018-08-13 ENCOUNTER — Other Ambulatory Visit: Payer: Self-pay | Admitting: Family Medicine

## 2018-08-13 DIAGNOSIS — E119 Type 2 diabetes mellitus without complications: Secondary | ICD-10-CM | POA: Diagnosis not present

## 2018-08-13 LAB — HEMOGLOBIN A1C: Hgb A1c MFr Bld: 9.9 % — ABNORMAL HIGH (ref 4.6–6.5)

## 2018-08-13 MED ORDER — DAPAGLIFLOZIN PROPANEDIOL 10 MG PO TABS: 10.0000 mg | ORAL_TABLET | Freq: Every day | ORAL | 3 refills | Status: DC

## 2018-08-14 ENCOUNTER — Encounter: Payer: Self-pay | Admitting: Family Medicine

## 2018-08-15 ENCOUNTER — Other Ambulatory Visit: Payer: Self-pay | Admitting: Family Medicine

## 2018-08-15 NOTE — Progress Notes (Signed)
Pt reports noncompliance with Metformin, will d/c Farxiga at this time.

## 2018-09-03 DIAGNOSIS — E119 Type 2 diabetes mellitus without complications: Secondary | ICD-10-CM | POA: Diagnosis not present

## 2018-09-03 DIAGNOSIS — H524 Presbyopia: Secondary | ICD-10-CM | POA: Diagnosis not present

## 2018-09-03 DIAGNOSIS — H40003 Preglaucoma, unspecified, bilateral: Secondary | ICD-10-CM | POA: Diagnosis not present

## 2018-09-03 DIAGNOSIS — H2513 Age-related nuclear cataract, bilateral: Secondary | ICD-10-CM | POA: Diagnosis not present

## 2018-09-03 LAB — HM DIABETES EYE EXAM

## 2018-09-25 ENCOUNTER — Ambulatory Visit: Payer: BLUE CROSS/BLUE SHIELD | Admitting: Family Medicine

## 2018-09-25 DIAGNOSIS — Z0289 Encounter for other administrative examinations: Secondary | ICD-10-CM

## 2018-11-11 ENCOUNTER — Encounter: Payer: Self-pay | Admitting: Family Medicine

## 2018-11-19 ENCOUNTER — Ambulatory Visit (INDEPENDENT_AMBULATORY_CARE_PROVIDER_SITE_OTHER): Payer: Self-pay | Admitting: Family Medicine

## 2018-11-19 ENCOUNTER — Encounter: Payer: Self-pay | Admitting: Family Medicine

## 2018-11-19 ENCOUNTER — Other Ambulatory Visit: Payer: Self-pay

## 2018-11-19 VITALS — BP 118/84 | HR 93 | Temp 98.0°F | Ht 68.0 in | Wt 225.2 lb

## 2018-11-19 DIAGNOSIS — I10 Essential (primary) hypertension: Secondary | ICD-10-CM

## 2018-11-19 DIAGNOSIS — T464X5A Adverse effect of angiotensin-converting-enzyme inhibitors, initial encounter: Secondary | ICD-10-CM

## 2018-11-19 DIAGNOSIS — E119 Type 2 diabetes mellitus without complications: Secondary | ICD-10-CM

## 2018-11-19 DIAGNOSIS — R05 Cough: Secondary | ICD-10-CM

## 2018-11-19 LAB — COMPREHENSIVE METABOLIC PANEL
ALT: 22 U/L (ref 0–53)
AST: 27 U/L (ref 0–37)
Albumin: 4.7 g/dL (ref 3.5–5.2)
Alkaline Phosphatase: 67 U/L (ref 39–117)
BUN: 13 mg/dL (ref 6–23)
CO2: 31 mEq/L (ref 19–32)
Calcium: 10 mg/dL (ref 8.4–10.5)
Chloride: 100 mEq/L (ref 96–112)
Creatinine, Ser: 1.1 mg/dL (ref 0.40–1.50)
GFR: 81.98 mL/min (ref >60.00)
Glucose, Bld: 149 mg/dL — ABNORMAL HIGH (ref 70–99)
Potassium: 3.9 mEq/L (ref 3.5–5.1)
Sodium: 141 mEq/L (ref 135–145)
Total Bilirubin: 0.5 mg/dL (ref 0.2–1.2)
Total Protein: 8.1 g/dL (ref 6.0–8.3)

## 2018-11-19 LAB — LIPID PANEL
Cholesterol: 161 mg/dL (ref 0–200)
HDL: 32.8 mg/dL — ABNORMAL LOW (ref >39.00)
LDL Cholesterol: 109 mg/dL — ABNORMAL HIGH (ref 0–99)
NonHDL: 128.6
Total CHOL/HDL Ratio: 5
Triglycerides: 97 mg/dL (ref 0.0–149.0)
VLDL: 19.4 mg/dL (ref 0.0–40.0)

## 2018-11-19 LAB — HEMOGLOBIN A1C: Hgb A1c MFr Bld: 8.2 % — ABNORMAL HIGH (ref 4.6–6.5)

## 2018-11-19 MED ORDER — LOSARTAN POTASSIUM-HCTZ 100-25 MG PO TABS: 1.0000 | ORAL_TABLET | Freq: Every day | ORAL | 3 refills | Status: DC

## 2018-11-19 MED ORDER — ATORVASTATIN CALCIUM 40 MG PO TABS: 40.0000 mg | ORAL_TABLET | Freq: Every day | ORAL | 1 refills | Status: DC

## 2018-11-19 MED ORDER — METFORMIN HCL 1000 MG PO TABS: ORAL_TABLET | ORAL | 4 refills | Status: DC

## 2018-11-19 MED ORDER — AMLODIPINE BESYLATE 10 MG PO TABS: 10.0000 mg | ORAL_TABLET | Freq: Every day | ORAL | 3 refills | Status: DC

## 2018-11-19 MED ORDER — POTASSIUM CHLORIDE CRYS ER 20 MEQ PO TBCR: 20.0000 meq | EXTENDED_RELEASE_TABLET | Freq: Every day | ORAL | 3 refills | Status: DC

## 2018-11-19 NOTE — Progress Notes (Signed)
Subjective:   Chief Complaint  Patient presents with  . Follow-up    Kenneth [name] is a 63 y.o. male here for follow-up of diabetes.   Kenneth [name] does not routinely check sugars. Patient does not require insulin.   Medications include: metformin bid Exercise: walking  Hypertension Patient presents for hypertension follow up. He does monitor home blood pressures. Blood pressures ranging on average from 120's/70-80's. He is compliant with medications. Patient has these side effects of medication: none He is adhering to a healthy diet overall. Exercise: walking  Hyperlipidemia Patient presents for dyslipidemia follow up. Currently being treated with Lipitor 40 mg/d and compliance with treatment thus far has been good. He denies myalgias. He is adhering to a healthy diet. Exercise: walking The patient is not known to have coexisting coronary artery disease.   Past Medical History:  Diagnosis Date  . Allergy   . Arthritis    ?knees,hips   . Cancer [facility])    prostate- robotic surgery, 2010.  . Diabetes mellitus    diagnosed 2008  . Hemorrhoids    h/o  . Hypercholesteremia   . Hypertension      Related testing: Date of retinal exam: Done Pneumovax: done Flu Shot: has not received yet  Review of Systems: Pulmonary:  No SOB Cardiovascular:  No chest pain  Objective:  BP 118/84 (BP Location: Left Arm, Patient Position: Sitting, Cuff Size: Large)   Pulse 93   Temp 98 F (36.7 C) (Temporal)   Ht 5\' 8"  (1.727 m)   Wt 225 lb 4 oz (102.2 kg)   SpO2 97%   BMI 34.25 kg/m  General:  Well developed, well nourished, in no apparent distress Eyes:  Pupils equal and round, sclera anicteric without injection  Lungs:  CTAB, no access msc use Cardio:  RRR, no bruits, no LE edema Psych: Age appropriate judgment and insight  Assessment:   Diabetes mellitus without complication (HCC) - Plan: Comprehensive metabolic panel, Hemoglobin A1c, Lipid panel  Essential hypertension  - Plan: losartan-hydrochlorothiazide (HYZAAR) 100-25 MG tablet  ACE-inhibitor cough - Plan: losartan-hydrochlorothiazide (HYZAAR) 100-25 MG tablet   Plan:   Orders as above. Counseled on diet and exercise. Has [employer] ins now, will wait on Td. Flu shot in Oct.  F/u in 6 mo if controlled or improved, otherwise 3 mo. The patient voiced understanding and agreement to the plan.  [place], DO 11/19/18 8:28 AM

## 2018-11-19 NOTE — Patient Instructions (Signed)
I recommend getting the flu shot in mid October. This suggestion would change if the CDC comes out with a different recommendation.   Give [place] 2-3 business days to get the results of your labs back.   Keep the diet clean and stay active.  Get the tetanus shot when you have new coverage.  Let us know if you need anything.

## 2019-01-07 ENCOUNTER — Other Ambulatory Visit: Payer: Self-pay

## 2019-01-07 ENCOUNTER — Ambulatory Visit (INDEPENDENT_AMBULATORY_CARE_PROVIDER_SITE_OTHER): Payer: BC Managed Care – PPO

## 2019-01-07 DIAGNOSIS — Z23 Encounter for immunization: Secondary | ICD-10-CM

## 2019-05-16 ENCOUNTER — Other Ambulatory Visit: Payer: Self-pay

## 2019-05-19 ENCOUNTER — Ambulatory Visit (INDEPENDENT_AMBULATORY_CARE_PROVIDER_SITE_OTHER): Payer: PRIVATE HEALTH INSURANCE | Admitting: Family Medicine

## 2019-05-19 ENCOUNTER — Encounter: Payer: Self-pay | Admitting: Family Medicine

## 2019-05-19 ENCOUNTER — Other Ambulatory Visit: Payer: Self-pay | Admitting: Family Medicine

## 2019-05-19 ENCOUNTER — Other Ambulatory Visit: Payer: Self-pay

## 2019-05-19 VITALS — BP 128/72 | HR 75 | Temp 97.8°F | Ht 67.5 in | Wt 225.4 lb

## 2019-05-19 DIAGNOSIS — E1165 Type 2 diabetes mellitus with hyperglycemia: Secondary | ICD-10-CM

## 2019-05-19 DIAGNOSIS — Z0000 Encounter for general adult medical examination without abnormal findings: Secondary | ICD-10-CM

## 2019-05-19 DIAGNOSIS — Z8546 Personal history of malignant neoplasm of prostate: Secondary | ICD-10-CM | POA: Diagnosis not present

## 2019-05-19 DIAGNOSIS — E876 Hypokalemia: Secondary | ICD-10-CM

## 2019-05-19 LAB — LIPID PANEL
Cholesterol: 174 mg/dL (ref 0–200)
HDL: 34 mg/dL — ABNORMAL LOW (ref >39.00)
LDL Cholesterol: 118 mg/dL — ABNORMAL HIGH (ref 0–99)
NonHDL: 140.44
Total CHOL/HDL Ratio: 5
Triglycerides: 111 mg/dL (ref 0.0–149.0)
VLDL: 22.2 mg/dL (ref 0.0–40.0)

## 2019-05-19 LAB — COMPREHENSIVE METABOLIC PANEL
ALT: 26 U/L (ref 0–53)
AST: 33 U/L (ref 0–37)
Albumin: 4.5 g/dL (ref 3.5–5.2)
Alkaline Phosphatase: 76 U/L (ref 39–117)
BUN: 13 mg/dL (ref 6–23)
CO2: 33 mEq/L — ABNORMAL HIGH (ref 19–32)
Calcium: 9.8 mg/dL (ref 8.4–10.5)
Chloride: 98 mEq/L (ref 96–112)
Creatinine, Ser: 1.04 mg/dL (ref 0.40–1.50)
GFR: 87.32 mL/min (ref >60.00)
Glucose, Bld: 210 mg/dL — ABNORMAL HIGH (ref 70–99)
Potassium: 3.4 mEq/L — ABNORMAL LOW (ref 3.5–5.1)
Sodium: 139 mEq/L (ref 135–145)
Total Bilirubin: 0.5 mg/dL (ref 0.2–1.2)
Total Protein: 7.9 g/dL (ref 6.0–8.3)

## 2019-05-19 LAB — PSA: PSA: 0.08 ng/mL — ABNORMAL LOW (ref 0.10–4.00)

## 2019-05-19 LAB — HEMOGLOBIN A1C: Hgb A1c MFr Bld: 8.9 % — ABNORMAL HIGH (ref 4.6–6.5)

## 2019-05-19 MED ORDER — SITAGLIPTIN PHOSPHATE 100 MG PO TABS: 100.0000 mg | ORAL_TABLET | Freq: Every day | ORAL | 3 refills | Status: DC

## 2019-05-19 NOTE — Addendum Note (Signed)
Addended by: Ames Coupe on: 05/19/2019 05:04 PM   Modules accepted: Orders

## 2019-05-19 NOTE — Progress Notes (Addendum)
Chief Complaint  Patient presents with  . Annual Exam    Well Male Kenneth [name] is here for a complete physical.   His last physical was >1 year ago.  Current diet: in general, diet could be healthier.  Current exercise: active at home Weight trend: stable Daytime fatigue? No. Seat belt? Yes.    Health maintenance Shingrix- No Colonoscopy- Yes Tetanus- No HIV- Yes Hep C- Yes   Past Medical History:  Diagnosis Date  . Allergy   . Arthritis    ?knees,hips   . Cancer [facility])    prostate- robotic surgery, 2010.  . Diabetes mellitus    diagnosed 2008  . Hemorrhoids    h/o  . Hypercholesteremia   . Hypertension       Past Surgical History:  Procedure Laterality Date  . ABDOMINAL HERNIA REPAIR  02/22/11  . HERNIA REPAIR  02/22/11   vental hernia  . PROSTATECTOMY  October 15th, 2010  . VENTRAL HERNIA REPAIR  02/22/2011   Procedure: LAPAROSCOPIC VENTRAL HERNIA;  Surgeon: Merrie Roof, MD;  Location: [place];  Service: General;  Laterality: N/A;  laparoscopic ventral hernia repair with mesh    Medications  Current Outpatient Medications on File Prior to Visit  Medication Sig Dispense Refill  . amLODipine (NORVASC) 10 MG tablet Take 1 tablet (10 mg total) by mouth daily. 90 tablet 3  . atorvastatin (LIPITOR) 40 MG tablet Take 1 tablet (40 mg total) by mouth daily. 90 tablet 1  . fluticasone (FLONASE) 50 MCG/ACT nasal spray Place 2 sprays into both nostrils daily. 48 g 3  . losartan-hydrochlorothiazide (HYZAAR) 100-25 MG tablet Take 1 tablet by mouth daily. 90 tablet 3  . metFORMIN (GLUCOPHAGE) 1000 MG tablet TAKE 1 TABLET BY MOUTH TWO  TIMES DAILY WITH A MEAL 180 tablet 4  . OVER THE COUNTER MEDICATION Take 1 tablet by mouth daily. Mega Red Joint    . potassium chloride SA (K-DUR) 20 MEQ tablet Take 1 tablet (20 mEq total) by mouth daily. 90 tablet 3   Allergies No Known Allergies  Family History Family History  Problem Relation Age of Onset  . Hypertension  Mother   . Diabetes Father   . Hypertension Father   . Anesthesia problems Neg Hx   . Hypotension Neg Hx   . Malignant hyperthermia Neg Hx   . Pseudochol deficiency Neg Hx   . Colon cancer Neg Hx     Review of Systems: Constitutional:  no fevers Eye:  no recent significant change in vision Ear/Nose/Mouth/Throat:  Ears:  no hearing loss Nose/Mouth/Throat:  no complaints of nasal congestion, no sore throat Cardiovascular:  no chest pain, no palpitations Respiratory:  no cough and no shortness of breath Gastrointestinal:  no abdominal pain, no change in bowel habits GU:  Male: negative for dysuria, frequency, and incontinence and negative for prostate symptoms Musculoskeletal/Extremities:  no pain, redness, or swelling of the joints Integumentary (Skin/Breast):  no abnormal skin lesions reported Neurologic:  no headaches Endocrine: No unexpected weight changes Hematologic/Lymphatic:  no abnormal bleeding  Exam BP 128/72 (BP Location: Left Arm, Patient Position: Sitting, Cuff Size: Normal)   Pulse 75   Temp 97.8 F (36.6 C) (Temporal)   Ht 5' 7.5" (1.715 m)   Wt 225 lb 6 oz (102.2 kg)   SpO2 94%   BMI 34.78 kg/m  General:  well developed, well nourished, in no apparent distress Skin:  no significant moles, warts, or growths Head:  no masses, lesions,  or tenderness Eyes:  pupils equal and round, sclera anicteric without injection Ears:  canals without lesions, TMs shiny without retraction, no obvious effusion, no erythema Nose:  nares patent, septum midline, mucosa normal Throat/Pharynx:  lips and gingiva without lesion; tongue and uvula midline; non-inflamed pharynx; no exudates or postnasal drainage Neck: neck supple without adenopathy, thyromegaly, or masses Cardiac: RRR, no bruits, no LE edema Lungs:  clear to auscultation, breath sounds equal bilaterally, no respiratory distress Rectal: Deferred Abd: BS+, S, NT, mildly distended, no masses or  organomegaly Musculoskeletal:  symmetrical muscle groups noted without atrophy or deformity Neuro:  gait normal; deep tendon reflexes normal and symmetric; sensation intact to pinprick b/l Psych: well oriented with normal range of affect and appropriate judgment/insight  Assessment and Plan  Well adult exam - Plan: Comprehensive metabolic panel, Lipid panel  History of prostate cancer - Plan: PSA  Type 2 diabetes mellitus with hyperglycemia, without long-term current use of insulin (HCC) - Plan: Hemoglobin A1c   Well 64 y.o. male. Counseled on diet and exercise. Hold on Tdap and Shingrix until 2 weeks out from Covid vaccine.  Immunizations, labs, and further orders as above. Follow up in 3-6 mo pending above results. . The patient voiced understanding and agreement to the plan.  [place], DO 05/19/19 8:53 AM

## 2019-05-19 NOTE — Patient Instructions (Addendum)

## 2019-05-23 ENCOUNTER — Encounter: Payer: Self-pay | Admitting: Family Medicine

## 2019-05-26 ENCOUNTER — Other Ambulatory Visit (INDEPENDENT_AMBULATORY_CARE_PROVIDER_SITE_OTHER): Payer: PRIVATE HEALTH INSURANCE

## 2019-05-26 ENCOUNTER — Other Ambulatory Visit: Payer: Self-pay

## 2019-05-26 DIAGNOSIS — E876 Hypokalemia: Secondary | ICD-10-CM | POA: Diagnosis not present

## 2019-05-26 LAB — BASIC METABOLIC PANEL
BUN: 11 mg/dL (ref 6–23)
CO2: 30 mEq/L (ref 19–32)
Calcium: 9.8 mg/dL (ref 8.4–10.5)
Chloride: 98 mEq/L (ref 96–112)
Creatinine, Ser: 0.86 mg/dL (ref 0.40–1.50)
GFR: 108.72 mL/min (ref >60.00)
Glucose, Bld: 155 mg/dL — ABNORMAL HIGH (ref 70–99)
Potassium: 3.7 mEq/L (ref 3.5–5.1)
Sodium: 137 mEq/L (ref 135–145)

## 2019-07-03 DIAGNOSIS — I10 Essential (primary) hypertension: Secondary | ICD-10-CM

## 2019-07-03 DIAGNOSIS — T464X5A Adverse effect of angiotensin-converting-enzyme inhibitors, initial encounter: Secondary | ICD-10-CM

## 2019-07-03 DIAGNOSIS — R05 Cough: Secondary | ICD-10-CM

## 2019-07-03 MED ORDER — AMLODIPINE BESYLATE 10 MG PO TABS: 10.0000 mg | ORAL_TABLET | Freq: Every day | ORAL | 3 refills | Status: DC

## 2019-07-03 MED ORDER — LOSARTAN POTASSIUM-HCTZ 100-25 MG PO TABS: 1.0000 | ORAL_TABLET | Freq: Every day | ORAL | 3 refills | Status: DC

## 2019-08-19 ENCOUNTER — Encounter: Payer: Self-pay | Admitting: Family Medicine

## 2019-08-19 ENCOUNTER — Other Ambulatory Visit: Payer: Self-pay

## 2019-08-19 ENCOUNTER — Ambulatory Visit (INDEPENDENT_AMBULATORY_CARE_PROVIDER_SITE_OTHER): Payer: PRIVATE HEALTH INSURANCE | Admitting: Family Medicine

## 2019-08-19 VITALS — BP 112/64 | HR 107 | Temp 96.3°F | Ht 67.0 in | Wt 213.4 lb

## 2019-08-19 DIAGNOSIS — E1169 Type 2 diabetes mellitus with other specified complication: Secondary | ICD-10-CM | POA: Diagnosis not present

## 2019-08-19 DIAGNOSIS — Z23 Encounter for immunization: Secondary | ICD-10-CM

## 2019-08-19 DIAGNOSIS — E669 Obesity, unspecified: Secondary | ICD-10-CM

## 2019-08-19 LAB — HEMOGLOBIN A1C: Hgb A1c MFr Bld: 7.7 % — ABNORMAL HIGH (ref 4.6–6.5)

## 2019-08-19 MED ORDER — POTASSIUM CHLORIDE CRYS ER 20 MEQ PO TBCR: 20.0000 meq | EXTENDED_RELEASE_TABLET | Freq: Every day | ORAL | 3 refills | Status: DC

## 2019-08-19 NOTE — Patient Instructions (Signed)
Give [place] 2-3 business days to get the results of your labs back. Our follow up hinges on the A1c results.   Keep the diet clean and stay active.  Keep up the good work. Try to increase your physical activity a bit.  Aim to do some physical exertion for 150 minutes per week. This is typically divided into 5 days per week, 30 minutes per day. The activity should be enough to get your heart rate up. Anything is better than nothing if you have time constraints.  Let us know if you need anything.

## 2019-08-19 NOTE — Addendum Note (Signed)
Addended by: Sharon Seller B on: 08/19/2019 09:29 AM   Modules accepted: Orders

## 2019-08-19 NOTE — Progress Notes (Signed)
Subjective:   Chief Complaint  Patient presents with  . Follow-up    diabetes    Kenneth [name] is a 64 y.o. male here for follow-up of diabetes.   Kenneth [name]'s self monitored glucose range is low 100's.  Patient denies hypoglycemic reactions. He checks his glucose levels every other day.  Patient does not require insulin.   Medications include: Metformin 1000 mg bid Diet is eating much healthier. He lost 12 lbs.  Exercise: some walking, active at home  Past Medical History:  Diagnosis Date  . Allergy   . Arthritis    ?knees,hips   . Cancer [facility])    prostate- robotic surgery, 2010.  . Diabetes mellitus    diagnosed 2008  . Hemorrhoids    h/o  . Hypercholesteremia   . Hypertension      Related testing: Date of retinal exam: Done Pneumovax: done  Objective:  BP 112/64 (BP Location: Left Arm, Patient Position: Sitting, Cuff Size: Normal)   Pulse (!) 107   Temp (!) 96.3 F (35.7 C) (Temporal)   Ht 5\' 7"  (1.702 m)   Wt 213 lb 6 oz (96.8 kg)   SpO2 98%   BMI 33.42 kg/m  General:  Well developed, well nourished, in no apparent distress Lungs:  CTAB, no access msc use Cardio:  RRR, no bruits, no LE edema Psych: Age appropriate judgment and insight  Assessment:   Diabetes mellitus type 2 in obese ([place]) - Plan: Hemoglobin A1c   Plan:   Orders as above. Counseled on diet and exercise. He has done well. Will add Actos if sugars not controlled. Tdap today.  F/u in 3-6 mo. The patient voiced understanding and agreement to the plan.  [place], DO 08/19/19 9:26 AM

## 2019-09-19 MED ORDER — METFORMIN HCL 1000 MG PO TABS: ORAL_TABLET | ORAL | 4 refills | Status: DC

## 2019-09-25 MED ORDER — ATORVASTATIN CALCIUM 40 MG PO TABS: 40.0000 mg | ORAL_TABLET | Freq: Every day | ORAL | 1 refills | Status: DC

## 2020-02-16 ENCOUNTER — Other Ambulatory Visit (HOSPITAL_BASED_OUTPATIENT_CLINIC_OR_DEPARTMENT_OTHER): Payer: Self-pay | Admitting: Internal Medicine

## 2020-02-16 ENCOUNTER — Ambulatory Visit: Payer: PRIVATE HEALTH INSURANCE | Attending: Internal Medicine

## 2020-02-16 DIAGNOSIS — Z23 Encounter for immunization: Secondary | ICD-10-CM

## 2020-02-20 ENCOUNTER — Ambulatory Visit (INDEPENDENT_AMBULATORY_CARE_PROVIDER_SITE_OTHER): Payer: PRIVATE HEALTH INSURANCE | Admitting: Family Medicine

## 2020-02-20 ENCOUNTER — Other Ambulatory Visit: Payer: Self-pay

## 2020-02-20 ENCOUNTER — Encounter: Payer: Self-pay | Admitting: Family Medicine

## 2020-02-20 VITALS — BP 118/80 | HR 83 | Temp 98.3°F | Ht 67.0 in | Wt 221.1 lb

## 2020-02-20 DIAGNOSIS — E1169 Type 2 diabetes mellitus with other specified complication: Secondary | ICD-10-CM | POA: Diagnosis not present

## 2020-02-20 DIAGNOSIS — Z23 Encounter for immunization: Secondary | ICD-10-CM | POA: Diagnosis not present

## 2020-02-20 DIAGNOSIS — E669 Obesity, unspecified: Secondary | ICD-10-CM

## 2020-02-20 DIAGNOSIS — I10 Essential (primary) hypertension: Secondary | ICD-10-CM

## 2020-02-20 LAB — COMPREHENSIVE METABOLIC PANEL
ALT: 19 U/L (ref 0–53)
AST: 30 U/L (ref 0–37)
Albumin: 4.6 g/dL (ref 3.5–5.2)
Alkaline Phosphatase: 64 U/L (ref 39–117)
BUN: 13 mg/dL (ref 6–23)
CO2: 33 mEq/L — ABNORMAL HIGH (ref 19–32)
Calcium: 9.8 mg/dL (ref 8.4–10.5)
Chloride: 97 mEq/L (ref 96–112)
Creatinine, Ser: 1.02 mg/dL (ref 0.40–1.50)
GFR: 78.16 mL/min (ref >60.00)
Glucose, Bld: 152 mg/dL — ABNORMAL HIGH (ref 70–99)
Potassium: 3.7 mEq/L (ref 3.5–5.1)
Sodium: 139 mEq/L (ref 135–145)
Total Bilirubin: 0.5 mg/dL (ref 0.2–1.2)
Total Protein: 8.1 g/dL (ref 6.0–8.3)

## 2020-02-20 LAB — LIPID PANEL
Cholesterol: 192 mg/dL (ref 0–200)
HDL: 38.2 mg/dL — ABNORMAL LOW (ref >39.00)
LDL Cholesterol: 132 mg/dL — ABNORMAL HIGH (ref 0–99)
NonHDL: 153.39
Total CHOL/HDL Ratio: 5
Triglycerides: 105 mg/dL (ref 0.0–149.0)
VLDL: 21 mg/dL (ref 0.0–40.0)

## 2020-02-20 LAB — HEMOGLOBIN A1C: Hgb A1c MFr Bld: 7.6 % — ABNORMAL HIGH (ref 4.6–6.5)

## 2020-02-20 MED ORDER — FLUTICASONE PROPIONATE 50 MCG/ACT NA SUSP: 2.0000 | Freq: Every day | NASAL | 3 refills | Status: DC

## 2020-02-20 MED ORDER — ATORVASTATIN CALCIUM 40 MG PO TABS: 40.0000 mg | ORAL_TABLET | Freq: Every day | ORAL | 2 refills | Status: DC

## 2020-02-20 NOTE — Addendum Note (Signed)
Addended by: Sharon Seller B on: 02/20/2020 09:40 AM   Modules accepted: Orders

## 2020-02-20 NOTE — Patient Instructions (Addendum)
Please call Dr. Henrene Pastor regarding your follow up colonoscopy. [phone]  Give [place] 2-3 business days to get the results of your labs back.   Keep the diet clean and stay active.  Ice/cold pack over area for 10-15 min twice daily.  Heat (pad or rice pillow in microwave) over affected area, 10-15 minutes twice daily.   Let us know if you need anything.  Knee Exercises It is normal to feel mild stretching, pulling, tightness, or discomfort as you do these exercises, but you should stop right away if you feel sudden pain or your pain gets worse. STRETCHING AND RANGE OF MOTION EXERCISES  These exercises warm up your muscles and joints and improve the movement and flexibility of your knee. These exercises also help to relieve pain, numbness, and tingling. Exercise A: Knee Extension, Prone  1. Lie on your abdomen on a bed. 2. Place your left / right knee just beyond the edge of the surface so your knee is not on the bed. You can put a towel under your left / right thigh just above your knee for comfort. 3. Relax your leg muscles and allow gravity to straighten your knee. You should feel a stretch behind your left / right knee. 4. Hold this position for 30 seconds. 5. Scoot up so your knee is supported between repetitions. Repeat 2 times. Complete this stretch 3 times per week. Exercise B: Knee Flexion, Active    1. Lie on your back with both knees straight. If this causes back discomfort, bend your left / right knee so your foot is flat on the floor. 2. Slowly slide your left / right heel back toward your buttocks until you feel a gentle stretch in the front of your knee or thigh. 3. Hold this position for 30 seconds. 4. Slowly slide your left / right heel back to the starting position. Repeat 2 times. Complete this exercise 3 times per week. Exercise C: Quadriceps, Prone    1. Lie on your abdomen on a firm surface, such as a bed or padded floor. 2. Bend your left / right knee and  hold your ankle. If you cannot reach your ankle or pant leg, loop a belt around your foot and grab the belt instead. 3. Gently pull your heel toward your buttocks. Your knee should not slide out to the side. You should feel a stretch in the front of your thigh and knee. 4. Hold this position for 30 seconds. Repeat 2 times. Complete this stretch 3 times per week. Exercise D: Hamstring, Supine  1. Lie on your back. 2. Loop a belt or towel over the ball of your left / right foot. The ball of your foot is on the walking surface, right under your toes. 3. Straighten your left / right knee and slowly pull on the belt to raise your leg until you feel a gentle stretch behind your knee. ? Do not let your left / right knee bend while you do this. ? Keep your other leg flat on the floor. 4. Hold this position for 30 seconds. Repeat 2 times. Complete this stretch 3 times per week. STRENGTHENING EXERCISES  These exercises build strength and endurance in your knee. Endurance is the ability to use your muscles for a long time, even after they get tired. Exercise E: Quadriceps, Isometric    1. Lie on your back with your left / right leg extended and your other knee bent. Put a rolled towel or small pillow under  your knee if told by your health care provider. 2. Slowly tense the muscles in the front of your left / right thigh. You should see your kneecap slide up toward your hip or see increased dimpling just above the knee. This motion will push the back of the knee toward the floor. 3. For 3 seconds, keep the muscle as tight as you can without increasing your pain. 4. Relax the muscles slowly and completely. Repeat for 10 total reps Repeat 2 ti mes. Complete this exercise 3 times per week. Exercise F: Straight Leg Raises - Quadriceps  1. Lie on your back with your left / right leg extended and your other knee bent. 2. Tense the muscles in the front of your left / right thigh. You should see your kneecap  slide up or see increased dimpling just above the knee. Your thigh may even shake a bit. 3. Keep these muscles tight as you raise your leg 4-6 inches (10-15 cm) off the floor. Do not let your knee bend. 4. Hold this position for 3 seconds. 5. Keep these muscles tense as you lower your leg. 6. Relax your muscles slowly and completely after each repetition. 10 total reps. Repeat 2 times. Complete this exercise 3 times per week.  Exercise G: Hamstring Curls    If told by your health care provider, do this exercise while wearing ankle weights. Begin with 5 lb weights (optional). Then increase the weight by 1 lb (0.5 kg) increments. Do not wear ankle weights that are more than 20 lbs to start with. 1. Lie on your abdomen with your legs straight. 2. Bend your left / right knee as far as you can without feeling pain. Keep your hips flat against the floor. 3. Hold this position for 3 seconds. 4. Slowly lower your leg to the starting position. Repeat for 10 reps.  Repeat 2 times. Complete this exercise 3 times per week. Exercise H: Squats (Quadriceps)  1. Stand in front of a table, with your feet and knees pointing straight ahead. You may rest your hands on the table for balance but not for support. 2. Slowly bend your knees and lower your hips like you are going to sit in a chair. ? Keep your weight over your heels, not over your toes. ? Keep your lower legs upright so they are parallel with the table legs. ? Do not let your hips go lower than your knees. ? Do not bend lower than told by your health care provider. ? If your knee pain increases, do not bend as low. 3. Hold the squat position for 1 second. 4. Slowly push with your legs to return to standing. Do not use your hands to pull yourself to standing. Repeat 2 times. Complete this exercise 3 times per week. Exercise I: Wall Slides (Quadriceps)    1. Lean your back against a smooth wall or door while you walk your feet out 18-24 inches  (46-61 cm) from it. 2. Place your feet hip-width apart. 3. Slowly slide down the wall or door until your knees Repeat 2 times. Complete this exercise every other day. 4. Exercise K: Straight Leg Raises - Hip Abductors  1. Lie on your side with your left / right leg in the top position. Lie so your head, shoulder, knee, and hip line up. You may bend your bottom knee to help you keep your balance. 2. Roll your hips slightly forward so your hips are stacked directly over each other and your  left / right knee is facing forward. 3. Leading with your heel, lift your top leg 4-6 inches (10-15 cm). You should feel the muscles in your outer hip lifting. ? Do not let your foot drift forward. ? Do not let your knee roll toward the ceiling. 4. Hold this position for 3 seconds. 5. Slowly return your leg to the starting position. 6. Let your muscles relax completely after each repetition. 10 total reps. Repeat 2 times. Complete this exercise 3 times per week. Exercise J: Straight Leg Raises - Hip Extensors  1. Lie on your abdomen on a firm surface. You can put a pillow under your hips if that is more comfortable. 2. Tense the muscles in your buttocks and lift your left / right leg about 4-6 inches (10-15 cm). Keep your knee straight as you lift your leg. 3. Hold this position for 3 seconds. 4. Slowly lower your leg to the starting position. 5. Let your leg relax completely after each repetition. Repeat 2 times. Complete this exercise 3 times per week. Document Released: 01/11/2005 Document Revised: 11/22/2015 Document Reviewed: 01/03/2015 Elsevier Interactive Patient Education  2017 [employer].

## 2020-02-20 NOTE — Progress Notes (Signed)
Subjective:   Chief Complaint  Patient presents with  . Follow-up    Kenneth [name] is a 64 y.o. male here for follow-up of diabetes.   He does not monitor his sugars routinely.  Patient does not require insulin.   Medications include: Metformin 1000 mg bid Diet is "so so".  Exercise: None lately   Hypertension Patient presents for hypertension follow up. He does not monitor home blood pressures. He is compliant with medications- Hyzaar 100-25 mg/d, Norvasc 10 mg/d. Patient has these side effects of medication: none Diet/exercise as above No Cp or SOB.   Past Medical History:  Diagnosis Date  . Allergy   . Arthritis    ?knees,hips   . Cancer [facility])    prostate- robotic surgery, 2010.  . Diabetes mellitus    diagnosed 2008  . Hemorrhoids    h/o  . Hypercholesteremia   . Hypertension      Related testing: Retinal exam: Done Pneumovax: done  Objective:  BP 118/80 (BP Location: Left Arm, Patient Position: Sitting, Cuff Size: Normal)   Pulse 83   Temp 98.3 F (36.8 C) (Oral)   Ht 5\' 7"  (1.702 m)   Wt 221 lb 2 oz (100.3 kg)   SpO2 98%   BMI 34.63 kg/m  General:  Well developed, well nourished, in no apparent distress Eyes:  Pupils equal and round, sclera anicteric without injection  Lungs:  CTAB, no access msc use Cardio:  RRR, no bruits, no LE edema Psych: Age appropriate judgment and insight  Assessment:   Diabetes mellitus type 2 in obese (HCC) - Plan: Comprehensive metabolic panel, Lipid panel, Hemoglobin A1c  Essential hypertension   Plan:   1. Cont Metformin 1000 mg bid. Counseled on diet and exercise. 2. Cont Hyzaar 100-25 mg/d, Norvasc 10 mg/d.  Dr. Blanch Media info given to schedule 3 yr f/u [facility].  F/u in 3-6 mo. The patient voiced understanding and agreement to the plan.  [place], DO 02/20/20 9:31 AM

## 2020-02-24 MED FILL — MODERNA COVID-19 VACCINE 10: 100 | 1 days supply | Qty: 0 | Fill #0

## 2020-04-20 ENCOUNTER — Encounter: Payer: Self-pay | Admitting: Internal Medicine

## 2020-05-05 ENCOUNTER — Encounter: Payer: Self-pay | Admitting: Internal Medicine

## 2020-06-17 ENCOUNTER — Ambulatory Visit (AMBULATORY_SURGERY_CENTER): Payer: Self-pay

## 2020-06-17 ENCOUNTER — Other Ambulatory Visit: Payer: Self-pay

## 2020-06-17 VITALS — Ht 67.0 in | Wt 227.0 lb

## 2020-06-17 DIAGNOSIS — Z86010 Personal history of colonic polyps: Secondary | ICD-10-CM

## 2020-06-17 MED ORDER — NA SULFATE-K SULFATE-MG SULF 17.5-3.13-1.6 GM/177ML PO SOLN: 1.0000 | Freq: Once | ORAL | 0 refills | Status: AC

## 2020-06-17 NOTE — Progress Notes (Signed)
No allergies to soy or egg Pt is not on blood thinners or diet pills Denies issues with sedation/intubation Denies atrial flutter/fib Denies constipation   Pt is aware of Covid safety and care partner requirements.      

## 2020-07-19 ENCOUNTER — Ambulatory Visit (AMBULATORY_SURGERY_CENTER): Payer: PRIVATE HEALTH INSURANCE | Admitting: Internal Medicine

## 2020-07-19 ENCOUNTER — Other Ambulatory Visit: Payer: Self-pay | Admitting: Internal Medicine

## 2020-07-19 ENCOUNTER — Encounter: Payer: Self-pay | Admitting: Internal Medicine

## 2020-07-19 ENCOUNTER — Other Ambulatory Visit: Payer: Self-pay

## 2020-07-19 VITALS — BP 102/67 | HR 64 | Temp 98.4°F | Resp 12 | Ht 67.0 in | Wt 227.0 lb

## 2020-07-19 DIAGNOSIS — K635 Polyp of colon: Secondary | ICD-10-CM

## 2020-07-19 DIAGNOSIS — Z86010 Personal history of colonic polyps: Secondary | ICD-10-CM

## 2020-07-19 DIAGNOSIS — D122 Benign neoplasm of ascending colon: Secondary | ICD-10-CM

## 2020-07-19 DIAGNOSIS — D123 Benign neoplasm of transverse colon: Secondary | ICD-10-CM

## 2020-07-19 MED ORDER — SODIUM CHLORIDE 0.9 % IV SOLN: 500.0000 mL | Freq: Once | INTRAVENOUS | Status: DC

## 2020-07-19 NOTE — Progress Notes (Signed)
Pt's states no medical or surgical changes since previsit or office visit. 

## 2020-07-19 NOTE — Progress Notes (Signed)
PT taken to PACU. Monitors in place. VSS. Report given to RN. 

## 2020-07-19 NOTE — Progress Notes (Signed)
C.w. VITAL SIGNS. 

## 2020-07-19 NOTE — Op Note (Signed)
[place] Patient Name: Kenneth [name] Procedure Date: 07/19/2020 11:35 AM MRN: [id number] Endoscopist: Docia Chuck. Henrene Pastor , MD Age: 65 Referring MD:  Date of Birth: 1955/06/13 Gender: Male Account #: [id number] Procedure:                Colonoscopy cold snare polypectomy x 2 Indications:              High risk colon cancer surveillance: Personal                            history of non-advanced adenoma. Index exam 2008                            was negative for neoplasia. Follow-up examination                            in 2018 with nonadvanced adenoma. However,                            unsatisfactory prep. Now for follow-up Medicines:                Monitored Anesthesia Care Procedure:                Pre-Anesthesia Assessment:                           - Prior to the procedure, a History and Physical                            was performed, and patient medications and                            allergies were reviewed. The patient's tolerance of                            previous anesthesia was also reviewed. The risks                            and benefits of the procedure and the sedation                            options and risks were discussed with the patient.                            All questions were answered, and informed consent                            was obtained. Prior Anticoagulants: The patient has                            taken no previous anticoagulant or antiplatelet                            agents. ASA Grade Assessment: II - A patient with  mild systemic disease. After reviewing the risks                            and benefits, the patient was deemed in                            satisfactory condition to undergo the procedure.                           After obtaining informed consent, the colonoscope                            was passed under direct vision. Throughout the                            procedure,  the patient's blood pressure, pulse, and                            oxygen saturations were monitored continuously. The                            Olympus [id number]) Colonoscope was                            introduced through the anus and advanced to the the                            cecum, identified by appendiceal orifice and                            ileocecal valve. The ileocecal valve, appendiceal                            orifice, and rectum were photographed. The quality                            of the bowel preparation was good. The colonoscopy                            was performed without difficulty. The patient                            tolerated the procedure well. The bowel preparation                            used was magnesium citrate followed by SUPREP via                            split dose instruction. Scope In: 11:47:07 AM Scope Out: 12:02:44 PM Scope Withdrawal Time: 0 hours 12 minutes 0 seconds  Total Procedure Duration: 0 hours 15 minutes 37 seconds  Findings:                 Two polyps were found in the transverse colon and  ascending colon. The polyps were 2 to 3 mm in size.                            These polyps were removed with a cold snare.                            Resection and retrieval were complete.                           Multiple diverticula were found in the left colon                            and right colon.                           The exam was otherwise without abnormality on                            direct and retroflexion views. Complications:            No immediate complications. Estimated blood loss:                            None. Estimated Blood Loss:     Estimated blood loss: none. Impression:               - Two 2 to 3 mm polyps in the transverse colon and                            in the ascending colon, removed with a cold snare.                            Resected and  retrieved.                           - Diverticulosis in the left colon and in the right                            colon.                           - The examination was otherwise normal on direct                            and retroflexion views. Recommendation:           - Repeat colonoscopy in 10 years for surveillance.                           - Patient has a contact number available for                            emergencies. The signs and symptoms of potential                            delayed  complications were discussed with the                            patient. Return to normal activities tomorrow.                            Written discharge instructions were provided to the                            patient.                           - Resume previous diet.                           - Continue present medications.                           - Await pathology results. Docia Chuck. Henrene Pastor, MD 07/19/2020 12:10:58 PM This report has been signed electronically.

## 2020-07-19 NOTE — Patient Instructions (Signed)
Discharge instructions given. Handouts on polyps and diverticulosis. Resume previous medications. YOU HAD AN ENDOSCOPIC PROCEDURE TODAY AT THE [facility]:   Refer to the procedure report that was given to you for any specific questions about what was found during the examination.  If the procedure report does not answer your questions, please call your gastroenterologist to clarify.  If you requested that your care partner not be given the details of your procedure findings, then the procedure report has been included in a sealed envelope for you to review at your convenience later.  YOU SHOULD EXPECT: Some feelings of bloating in the abdomen. Passage of more gas than usual.  Walking can help get rid of the air that was put into your GI tract during the procedure and reduce the bloating. If you had a lower endoscopy (such as a colonoscopy or flexible sigmoidoscopy) you may notice spotting of blood in your stool or on the toilet paper. If you underwent a bowel prep for your procedure, you may not have a normal bowel movement for a few days.  Please Note:  You might notice some irritation and congestion in your nose or some drainage.  This is from the oxygen used during your procedure.  There is no need for concern and it should clear up in a day or so.  SYMPTOMS TO REPORT IMMEDIATELY:   Following lower endoscopy (colonoscopy or flexible sigmoidoscopy):  Excessive amounts of blood in the stool  Significant tenderness or worsening of abdominal pains  Swelling of the abdomen that is new, acute  Fever of 100F or higher   For urgent or emergent issues, a gastroenterologist can be reached at any hour by calling [phone]. Do not use MyChart messaging for urgent concerns.    DIET:  We do recommend a small meal at first, but then you may proceed to your regular diet.  Drink plenty of fluids but you should avoid alcoholic beverages for 24 hours.  ACTIVITY:  You should plan to take  it easy for the rest of today and you should NOT DRIVE or use heavy machinery until tomorrow (because of the sedation medicines used during the test).    FOLLOW UP: Our staff will call the number listed on your records 48-72 hours following your procedure to check on you and address any questions or concerns that you may have regarding the information given to you following your procedure. If we do not reach you, we will leave a message.  We will attempt to reach you two times.  During this call, we will ask if you have developed any symptoms of COVID 19. If you develop any symptoms (ie: fever, flu-like symptoms, shortness of breath, cough etc.) before then, please call [phone].  If you test positive for Covid 19 in the 2 weeks post procedure, please call and report this information to us.    If any biopsies were taken you will be contacted by phone or by letter within the next 1-3 weeks.  Please call us at [phone] if you have not heard about the biopsies in 3 weeks.    SIGNATURES/CONFIDENTIALITY: You and/or your care partner have signed paperwork which will be entered into your electronic medical record.  These signatures attest to the fact that that the information above on your After Visit Summary has been reviewed and is understood.  Full responsibility of the confidentiality of this discharge information lies with you and/or your care-partner. 

## 2020-07-19 NOTE — Progress Notes (Signed)
Called to room to assist during endoscopic procedure.  Patient ID and intended procedure confirmed with present staff. Received instructions for my participation in the procedure from the performing physician.  

## 2020-07-21 ENCOUNTER — Telehealth: Payer: Self-pay

## 2020-07-21 ENCOUNTER — Other Ambulatory Visit: Payer: Self-pay | Admitting: Family Medicine

## 2020-07-21 NOTE — Telephone Encounter (Signed)
First post procedure follow up call, no answer 

## 2020-07-21 NOTE — Telephone Encounter (Signed)
  Follow up Call-  Call back number 07/19/2020  Post procedure Call Back phone  # [phone]  Permission to leave phone message Yes  Some recent data might be hidden     Patient questions:  Do you have a fever, pain , or abdominal swelling? No. Pain Score  0 *  Have you tolerated food without any problems? Yes.    Have you been able to return to your normal activities? Yes.    Do you have any questions about your discharge instructions: Diet   No. Medications  No. Follow up visit  No.  Do you have questions or concerns about your Care? No.  Actions: * If pain score is 4 or above: No action needed, pain <4.  1. Have you developed a fever since your procedure? no  2.   Have you had an respiratory symptoms (SOB or cough) since your procedure? no  3.   Have you tested positive for COVID 19 since your procedure no  4.   Have you had any family members/close contacts diagnosed with the COVID 19 since your procedure?  no   If yes to any of these questions please route to Joylene John, RN and Joella Prince, RN

## 2020-07-28 ENCOUNTER — Other Ambulatory Visit: Payer: Self-pay | Admitting: Family Medicine

## 2020-08-09 ENCOUNTER — Encounter: Payer: Self-pay | Admitting: Internal Medicine

## 2020-09-13 ENCOUNTER — Other Ambulatory Visit: Payer: Self-pay | Admitting: Family Medicine

## 2020-09-28 ENCOUNTER — Other Ambulatory Visit (HOSPITAL_COMMUNITY): Payer: Self-pay

## 2020-10-07 ENCOUNTER — Telehealth: Payer: Self-pay | Admitting: Family Medicine

## 2020-10-07 NOTE — Telephone Encounter (Signed)
Pt had coupon for Farxiga at front desk since 08-14-2018- pt never came to pick up. Coupon given back to CMA.

## 2020-10-19 ENCOUNTER — Other Ambulatory Visit: Payer: Self-pay | Admitting: Family Medicine

## 2020-10-25 ENCOUNTER — Other Ambulatory Visit: Payer: Self-pay | Admitting: Family Medicine

## 2020-12-07 ENCOUNTER — Encounter: Payer: Self-pay | Admitting: Family Medicine

## 2020-12-07 ENCOUNTER — Ambulatory Visit (INDEPENDENT_AMBULATORY_CARE_PROVIDER_SITE_OTHER): Payer: PRIVATE HEALTH INSURANCE | Admitting: Family Medicine

## 2020-12-07 ENCOUNTER — Other Ambulatory Visit: Payer: Self-pay

## 2020-12-07 ENCOUNTER — Other Ambulatory Visit: Payer: Self-pay | Admitting: Family Medicine

## 2020-12-07 VITALS — BP 120/82 | HR 76 | Temp 98.0°F | Ht 67.0 in | Wt 222.1 lb

## 2020-12-07 DIAGNOSIS — R058 Other specified cough: Secondary | ICD-10-CM

## 2020-12-07 DIAGNOSIS — Z23 Encounter for immunization: Secondary | ICD-10-CM

## 2020-12-07 DIAGNOSIS — E1169 Type 2 diabetes mellitus with other specified complication: Secondary | ICD-10-CM | POA: Diagnosis not present

## 2020-12-07 DIAGNOSIS — T464X5A Adverse effect of angiotensin-converting-enzyme inhibitors, initial encounter: Secondary | ICD-10-CM

## 2020-12-07 DIAGNOSIS — B353 Tinea pedis: Secondary | ICD-10-CM

## 2020-12-07 DIAGNOSIS — I10 Essential (primary) hypertension: Secondary | ICD-10-CM | POA: Diagnosis not present

## 2020-12-07 DIAGNOSIS — Z0000 Encounter for general adult medical examination without abnormal findings: Secondary | ICD-10-CM

## 2020-12-07 DIAGNOSIS — E669 Obesity, unspecified: Secondary | ICD-10-CM | POA: Diagnosis not present

## 2020-12-07 LAB — COMPREHENSIVE METABOLIC PANEL
ALT: 14 U/L (ref 0–53)
AST: 25 U/L (ref 0–37)
Albumin: 4.3 g/dL (ref 3.5–5.2)
Alkaline Phosphatase: 72 U/L (ref 39–117)
BUN: 12 mg/dL (ref 6–23)
CO2: 29 mEq/L (ref 19–32)
Calcium: 9.5 mg/dL (ref 8.4–10.5)
Chloride: 102 mEq/L (ref 96–112)
Creatinine, Ser: 1.02 mg/dL (ref 0.40–1.50)
GFR: 77.72 mL/min (ref >60.00)
Glucose, Bld: 171 mg/dL — ABNORMAL HIGH (ref 70–99)
Potassium: 4.1 mEq/L (ref 3.5–5.1)
Sodium: 139 mEq/L (ref 135–145)
Total Bilirubin: 0.5 mg/dL (ref 0.2–1.2)
Total Protein: 7.5 g/dL (ref 6.0–8.3)

## 2020-12-07 LAB — LIPID PANEL
Cholesterol: 161 mg/dL (ref 0–200)
HDL: 35.5 mg/dL — ABNORMAL LOW (ref >39.00)
LDL Cholesterol: 106 mg/dL — ABNORMAL HIGH (ref 0–99)
NonHDL: 125.95
Total CHOL/HDL Ratio: 5
Triglycerides: 99 mg/dL (ref 0.0–149.0)
VLDL: 19.8 mg/dL (ref 0.0–40.0)

## 2020-12-07 LAB — CBC
HCT: 37.4 % — ABNORMAL LOW (ref 39.0–52.0)
Hemoglobin: 12.2 g/dL — ABNORMAL LOW (ref 13.0–17.0)
MCHC: 32.7 g/dL (ref 30.0–36.0)
MCV: 84.8 fl (ref 78.0–100.0)
Platelets: 240 10*3/uL (ref 150.0–400.0)
RBC: 4.41 Mil/uL (ref 4.22–5.81)
RDW: 14.6 % (ref 11.5–15.5)
WBC: 3.5 10*3/uL — ABNORMAL LOW (ref 4.0–10.5)

## 2020-12-07 LAB — HEMOGLOBIN A1C: Hgb A1c MFr Bld: 9.6 % — ABNORMAL HIGH (ref 4.6–6.5)

## 2020-12-07 LAB — MICROALBUMIN / CREATININE URINE RATIO
Creatinine,U: 134.2 mg/dL
Microalb Creat Ratio: 1.5 mg/g (ref 0.0–30.0)
Microalb, Ur: 2 mg/dL — ABNORMAL HIGH (ref 0.0–1.9)

## 2020-12-07 MED ORDER — DAPAGLIFLOZIN PROPANEDIOL 10 MG PO TABS: 10.0000 mg | ORAL_TABLET | Freq: Every day | ORAL | 2 refills | Status: DC

## 2020-12-07 MED ORDER — POTASSIUM CHLORIDE CRYS ER 20 MEQ PO TBCR: 20.0000 meq | EXTENDED_RELEASE_TABLET | Freq: Every day | ORAL | 2 refills | Status: DC

## 2020-12-07 MED ORDER — AMLODIPINE BESYLATE 10 MG PO TABS: 10.0000 mg | ORAL_TABLET | Freq: Every day | ORAL | 2 refills | Status: DC

## 2020-12-07 MED ORDER — KETOCONAZOLE 2 % EX CREA: 1.0000 "application " | TOPICAL_CREAM | Freq: Every day | CUTANEOUS | 0 refills | Status: AC

## 2020-12-07 MED ORDER — ATORVASTATIN CALCIUM 40 MG PO TABS: 40.0000 mg | ORAL_TABLET | Freq: Every day | ORAL | 2 refills | Status: DC

## 2020-12-07 MED ORDER — LOSARTAN POTASSIUM-HCTZ 100-25 MG PO TABS: 1.0000 | ORAL_TABLET | Freq: Every day | ORAL | 2 refills | Status: DC

## 2020-12-07 MED ORDER — METFORMIN HCL 1000 MG PO TABS: ORAL_TABLET | ORAL | 2 refills | Status: DC

## 2020-12-07 NOTE — Patient Instructions (Addendum)
Stay hydrated.  Give [place] 2-3 business days to get the results of your labs back.   Keep the diet clean and stay active.  The new Shingrix vaccine (for shingles) is a 2 shot series. It can make people feel low energy, achy and almost like they have the flu for 48 hours after injection. Please plan accordingly when deciding on when to get this shot. Call our office for a nurse visit appointment to get this. The second shot of the series is less severe regarding the side effects, but it still lasts 48 hours.   Please schedule your eye exam.   Foods that may reduce pain: 1) Ginger 2) Blueberries 3) Salmon 4) Pumpkin seeds 5) dark chocolate 6) turmeric 7) tart cherries 8) virgin olive oil 9) chilli peppers 10) mint 11) red wine  Let us know if you need anything.

## 2020-12-07 NOTE — Addendum Note (Signed)
Addended by: Sharon Seller B on: 12/07/2020 10:10 AM   Modules accepted: Orders

## 2020-12-07 NOTE — Progress Notes (Signed)
Chief Complaint  Patient presents with   hand cramps when working    Well Male Kenneth [name] is here for a complete physical.   His last physical was >1 year ago.  Current diet: in general, a "healthy" diet.  Current exercise: walking Weight trend: stable Fatigue out of ordinary? No. Seat belt? Yes.    Health maintenance Shingrix- No Colonoscopy- Yes Tetanus- Yes HIV- Yes Hep C- Yes   Past Medical History:  Diagnosis Date   Allergy    Arthritis    ?knees,hips    Cancer ([place])    prostate- robotic surgery, 2010.   Diabetes mellitus    diagnosed 2008   Hemorrhoids    h/o   Hypercholesteremia    Hypertension       Past Surgical History:  Procedure Laterality Date   ABDOMINAL HERNIA REPAIR  02/22/11   COLONOSCOPY  2018   HERNIA REPAIR  02/22/11   vental hernia   PROSTATECTOMY  October 15th, 2010   VENTRAL HERNIA REPAIR  02/22/2011   Procedure: LAPAROSCOPIC VENTRAL HERNIA;  Surgeon: Merrie Roof, MD;  Location: [facility];  Service: General;  Laterality: N/A;  laparoscopic ventral hernia repair with mesh    Medications  Current Outpatient Medications on File Prior to Visit  Medication Sig Dispense Refill   fluticasone (FLONASE) 50 MCG/ACT nasal spray Place 2 sprays into both nostrils daily. 48 g 3   OVER THE COUNTER MEDICATION Take 1 tablet by mouth daily. Mega Red Joint     Turmeric (QC TUMERIC COMPLEX PO) Take by mouth.      Allergies No Known Allergies  Family History Family History  Problem Relation Age of Onset   Hypertension Mother    Diabetes Father    Hypertension Father    Anesthesia problems Neg Hx    Hypotension Neg Hx    Malignant hyperthermia Neg Hx    Pseudochol deficiency Neg Hx    Colon cancer Neg Hx    Colon polyps Neg Hx    Esophageal cancer Neg Hx    Rectal cancer Neg Hx    Stomach cancer Neg Hx     Review of Systems: Constitutional:  no fevers Eye:  no recent significant change in vision Ear/Nose/Mouth/Throat:  Ears:  no  hearing loss Nose/Mouth/Throat:  no complaints of nasal congestion, no sore throat Cardiovascular:  no chest pain Respiratory:  no shortness of breath Gastrointestinal:  no change in bowel habits GU:  Male: negative for dysuria, frequency Musculoskeletal/Extremities:  no joint pain Integumentary (Skin/Breast):  no abnormal skin lesions reported Neurologic:  no headaches Endocrine: No unexpected weight changes Hematologic/Lymphatic:  no abnormal bleeding  Exam BP 120/82   Pulse 76   Temp 98 F (36.7 C) (Oral)   Ht 5\' 7"  (1.702 m)   Wt 222 lb 2 oz (100.8 kg)   SpO2 98%   BMI 34.79 kg/m  General:  well developed, well nourished, in no apparent distress Skin: macerated tissue between 4/5th digits on b/l feet; otherwise no significant moles, warts, or growths Head:  no masses, lesions, or tenderness Eyes:  pupils equal and round, sclera anicteric without injection Ears:  canals without lesions, TMs shiny without retraction, no obvious effusion, no erythema Nose:  nares patent, septum midline, mucosa normal Throat/Pharynx:  lips and gingiva without lesion; tongue and uvula midline; non-inflamed pharynx; no exudates or postnasal drainage Neck: neck supple without adenopathy, thyromegaly, or masses Cardiac: RRR, no bruits, no LE edema; DP pulses 2+ b/l, PT  pulses 1+ b/l Lungs:  clear to auscultation, breath sounds equal bilaterally, no respiratory distress Abdomen: BS+, soft, non-tender, non-distended, no masses or organomegaly noted Rectal: Deferred Musculoskeletal:  symmetrical muscle groups noted without atrophy or deformity Neuro:  gait normal; deep tendon reflexes normal and symmetric Psych: well oriented with normal range of affect and appropriate judgment/insight  Assessment and Plan  Well adult exam  Diabetes mellitus type 2 in obese ([place]) - Plan: CBC, Comprehensive metabolic panel, Microalbumin / creatinine urine ratio, Hemoglobin A1c, Lipid panel  Essential hypertension  - Plan: losartan-hydrochlorothiazide (HYZAAR) 100-25 MG tablet  ACE-inhibitor cough - Plan: losartan-hydrochlorothiazide (HYZAAR) 100-25 MG tablet   Well 65 y.o. male. Counseled on diet and exercise. Counseled on risks and benefits of prostate cancer screening with PSA. The patient agrees to undergo testing. Immunizations, labs, and further orders as above. Flu shot today. Rec'd Shingrix.  Rec'd getting eye exam.  Follow up in 6 mo pending above. The patient voiced understanding and agreement to the plan.  [place], DO 12/07/20 9:53 AM

## 2020-12-08 MED ORDER — DAPAGLIFLOZIN PROPANEDIOL 10 MG PO TABS: 10.0000 mg | ORAL_TABLET | Freq: Every day | ORAL | 2 refills | Status: DC

## 2021-01-11 ENCOUNTER — Other Ambulatory Visit: Payer: Self-pay

## 2021-01-11 ENCOUNTER — Encounter: Payer: Self-pay | Admitting: Family Medicine

## 2021-01-11 ENCOUNTER — Ambulatory Visit (INDEPENDENT_AMBULATORY_CARE_PROVIDER_SITE_OTHER): Payer: PRIVATE HEALTH INSURANCE | Admitting: Family Medicine

## 2021-01-11 VITALS — BP 118/80 | HR 86 | Temp 98.3°F | Ht 67.0 in | Wt 214.0 lb

## 2021-01-11 DIAGNOSIS — E1169 Type 2 diabetes mellitus with other specified complication: Secondary | ICD-10-CM

## 2021-01-11 DIAGNOSIS — E669 Obesity, unspecified: Secondary | ICD-10-CM

## 2021-01-11 LAB — BASIC METABOLIC PANEL
BUN: 17 mg/dL (ref 6–23)
CO2: 28 mEq/L (ref 19–32)
Calcium: 9.5 mg/dL (ref 8.4–10.5)
Chloride: 105 mEq/L (ref 96–112)
Creatinine, Ser: 0.99 mg/dL (ref 0.40–1.50)
GFR: 80.5 mL/min (ref >60.00)
Glucose, Bld: 122 mg/dL — ABNORMAL HIGH (ref 70–99)
Potassium: 4.1 mEq/L (ref 3.5–5.1)
Sodium: 141 mEq/L (ref 135–145)

## 2021-01-11 MED ORDER — RYBELSUS 7 MG PO TABS: 7.0000 mg | ORAL_TABLET | Freq: Every day | ORAL | 2 refills | Status: DC

## 2021-01-11 MED ORDER — PIOGLITAZONE HCL 30 MG PO TABS
30.0000 mg | ORAL_TABLET | Freq: Every day | ORAL | 2 refills | Status: DC
Start: 2021-01-11 — End: 2022-04-07

## 2021-01-11 NOTE — Patient Instructions (Signed)
Give [place] 2-3 business days to get the results of your labs back.   Keep the diet clean and stay active.  Strong work with your weight loss.  I have sent a traditionally affordable medicine electronically to [facility]. I want you to try the paper prescription first. If it is too expensive, don't fill it and pick up the other one.   Let us know if you need anything.

## 2021-01-11 NOTE — Progress Notes (Signed)
Chief Complaint  Patient presents with   Follow-up    1 month    Subjective: Patient is a 65 y.o. male here for DM f/u.  Low 100's in the AM fasting. Diet is slightly better, he is active at home. Lost around 8 lbs since last visit 6 weeks.  No hypoglycemia. Taking Metformin 1000 mg bid and recently Farxiga 10 mg/d. Unfortunately the latter was $300 for a month.   Past Medical History:  Diagnosis Date   Allergy    Arthritis    ?knees,hips    Cancer [facility])    prostate- robotic surgery, 2010.   Diabetes mellitus    diagnosed 2008   Hemorrhoids    h/o   Hypercholesteremia    Hypertension     Objective: BP 118/80   Pulse 86   Temp 98.3 F (36.8 C) (Oral)   Ht 5\' 7"  (1.702 m)   Wt 214 lb (97.1 kg)   SpO2 97%   BMI 33.52 kg/m  General: Awake, appears stated age Heart: RRR, no bruits or LE edema Lungs: CTAB, no rales, wheezes or rhonchi. No accessory muscle use Psych: Age appropriate judgment and insight, normal affect and mood  Assessment and Plan: Diabetes mellitus type 2 in obese ([place]) - Plan: pioglitazone (ACTOS) 30 MG tablet, Semaglutide (RYBELSUS) 7 MG TABS, Basic metabolic panel  Cronic, unstable. Cont Metformin 1000 mg bid. Will add Actos 30 mg/d as contingency. Would prefer Rybelsus 7 mg/d assuming he does not have to pay too much. Ck BMP for accuracy of meter, 114 this AM.  F/u in 2 mo.  The patient voiced understanding and agreement to the plan.  [place], DO 01/11/21  9:42 AM

## 2021-03-30 ENCOUNTER — Ambulatory Visit: Payer: PRIVATE HEALTH INSURANCE | Attending: Internal Medicine

## 2021-03-30 DIAGNOSIS — Z23 Encounter for immunization: Secondary | ICD-10-CM

## 2021-03-31 ENCOUNTER — Other Ambulatory Visit (HOSPITAL_BASED_OUTPATIENT_CLINIC_OR_DEPARTMENT_OTHER): Payer: Self-pay

## 2021-03-31 MED ORDER — PFIZER COVID-19 VAC BIVALENT 30 MCG/0.3ML IM SUSP
INTRAMUSCULAR | 0 refills | Status: DC
  Filled 2021-03-31: qty 0.3, 1d supply, fill #0

## 2021-03-31 NOTE — Progress Notes (Signed)
° °  Covid-19 Vaccination Clinic  Name:  Kenneth [name]    MRN: [id number] DOB: [birth year]  03/31/2021  [name] was observed post Covid-19 immunization for 15 minutes without incident. He was provided with Vaccine Information Sheet and instruction to access the V-Safe system.   Mr. College was instructed to call 911 with any severe reactions post vaccine: Difficulty breathing  Swelling of face and throat  A fast heartbeat  A bad rash all over body  Dizziness and weakness   Immunizations Administered     Name Date Dose VIS Date Route   Pfizer Covid-19 Vaccine Bivalent Booster 03/30/2021  2:18 PM 0.3 mL 11/10/2020 Intramuscular   Manufacturer: [place]   Lot: [id number]   [place]: [phone]

## 2021-07-06 ENCOUNTER — Telehealth: Payer: Self-pay | Admitting: Family Medicine

## 2021-07-06 NOTE — Telephone Encounter (Signed)
[facility] is calling to be able to get last labs, last few office notes, and a med list for the patient. She states that he is part of a diabetes program with them and therefore they need the information. It can be faxed to [phone] ATT. CM-Betty ?

## 2021-07-06 NOTE — Telephone Encounter (Signed)
Faxed requested information to [facility] [employer] ?Attn as requested ?

## 2021-09-16 ENCOUNTER — Other Ambulatory Visit: Payer: Self-pay | Admitting: Family Medicine

## 2021-09-25 ENCOUNTER — Other Ambulatory Visit: Payer: Self-pay | Admitting: Family Medicine

## 2021-09-28 ENCOUNTER — Telehealth: Payer: Self-pay | Admitting: Family Medicine

## 2021-09-28 NOTE — Telephone Encounter (Signed)
Faxed OV from last CPE

## 2021-09-28 NOTE — Telephone Encounter (Signed)
[employer] [facility] case manger Inez Catalina asked if we could fax ov notes after he sees Dr.Wendling for the cpe. [phone]

## 2021-10-06 ENCOUNTER — Ambulatory Visit (INDEPENDENT_AMBULATORY_CARE_PROVIDER_SITE_OTHER): Payer: PPO | Admitting: Family Medicine

## 2021-10-06 ENCOUNTER — Encounter: Payer: Self-pay | Admitting: Family Medicine

## 2021-10-06 VITALS — BP 128/80 | HR 75 | Temp 97.9°F | Ht 68.0 in | Wt 211.1 lb

## 2021-10-06 DIAGNOSIS — T464X5A Adverse effect of angiotensin-converting-enzyme inhibitors, initial encounter: Secondary | ICD-10-CM | POA: Diagnosis not present

## 2021-10-06 DIAGNOSIS — Z0000 Encounter for general adult medical examination without abnormal findings: Secondary | ICD-10-CM | POA: Diagnosis not present

## 2021-10-06 DIAGNOSIS — Z125 Encounter for screening for malignant neoplasm of prostate: Secondary | ICD-10-CM

## 2021-10-06 DIAGNOSIS — R058 Other specified cough: Secondary | ICD-10-CM | POA: Diagnosis not present

## 2021-10-06 DIAGNOSIS — E669 Obesity, unspecified: Secondary | ICD-10-CM

## 2021-10-06 DIAGNOSIS — I10 Essential (primary) hypertension: Secondary | ICD-10-CM | POA: Diagnosis not present

## 2021-10-06 DIAGNOSIS — E1169 Type 2 diabetes mellitus with other specified complication: Secondary | ICD-10-CM | POA: Diagnosis not present

## 2021-10-06 DIAGNOSIS — Z23 Encounter for immunization: Secondary | ICD-10-CM | POA: Diagnosis not present

## 2021-10-06 LAB — COMPREHENSIVE METABOLIC PANEL
ALT: 10 U/L (ref 0–53)
AST: 18 U/L (ref 0–37)
Albumin: 4.6 g/dL (ref 3.5–5.2)
Alkaline Phosphatase: 60 U/L (ref 39–117)
BUN: 14 mg/dL (ref 6–23)
CO2: 31 mEq/L (ref 19–32)
Calcium: 9.5 mg/dL (ref 8.4–10.5)
Chloride: 101 mEq/L (ref 96–112)
Creatinine, Ser: 0.93 mg/dL (ref 0.40–1.50)
GFR: 86.33 mL/min (ref >60.00)
Glucose, Bld: 125 mg/dL — ABNORMAL HIGH (ref 70–99)
Potassium: 3.9 mEq/L (ref 3.5–5.1)
Sodium: 140 mEq/L (ref 135–145)
Total Bilirubin: 0.4 mg/dL (ref 0.2–1.2)
Total Protein: 7.8 g/dL (ref 6.0–8.3)

## 2021-10-06 LAB — CBC
HCT: 38.2 % — ABNORMAL LOW (ref 39.0–52.0)
Hemoglobin: 12.5 g/dL — ABNORMAL LOW (ref 13.0–17.0)
MCHC: 32.7 g/dL (ref 30.0–36.0)
MCV: 85.3 fl (ref 78.0–100.0)
Platelets: 241 10*3/uL (ref 150.0–400.0)
RBC: 4.48 Mil/uL (ref 4.22–5.81)
RDW: 15.4 % (ref 11.5–15.5)
WBC: 4.3 10*3/uL (ref 4.0–10.5)

## 2021-10-06 LAB — LIPID PANEL
Cholesterol: 173 mg/dL (ref 0–200)
HDL: 40.5 mg/dL (ref >39.00)
LDL Cholesterol: 117 mg/dL — ABNORMAL HIGH (ref 0–99)
NonHDL: 132.89
Total CHOL/HDL Ratio: 4
Triglycerides: 78 mg/dL (ref 0.0–149.0)
VLDL: 15.6 mg/dL (ref 0.0–40.0)

## 2021-10-06 LAB — PSA: PSA: 0.09 ng/mL — ABNORMAL LOW (ref 0.10–4.00)

## 2021-10-06 LAB — MICROALBUMIN / CREATININE URINE RATIO
Creatinine,U: 152.2 mg/dL
Microalb Creat Ratio: 1.2 mg/g (ref 0.0–30.0)
Microalb, Ur: 1.9 mg/dL (ref 0.0–1.9)

## 2021-10-06 LAB — HEMOGLOBIN A1C: Hgb A1c MFr Bld: 7.4 % — ABNORMAL HIGH (ref 4.6–6.5)

## 2021-10-06 MED ORDER — AMLODIPINE BESYLATE 10 MG PO TABS: 10.0000 mg | ORAL_TABLET | Freq: Every day | ORAL | 1 refills | Status: DC

## 2021-10-06 MED ORDER — LOSARTAN POTASSIUM-HCTZ 100-25 MG PO TABS: 1.0000 | ORAL_TABLET | Freq: Every day | ORAL | 1 refills | Status: DC

## 2021-10-06 MED ORDER — FLUTICASONE PROPIONATE 50 MCG/ACT NA SUSP: 2.0000 | Freq: Every day | NASAL | 3 refills | Status: DC

## 2021-10-06 NOTE — Progress Notes (Signed)
Chief Complaint  Patient presents with   Annual Exam    Well Male Kenneth [name] is here for a complete physical.   His last physical was >1 year ago.  Current diet: in general, a "healthy" diet.   Current exercise: active renovating houses and building Weight trend: intentionally losing Fatigue out of ordinary? No. Seat belt? Yes.   Advanced directive? No  Health maintenance Shingrix- No Colonoscopy- Yes Tetanus- Yes Hep C- Yes Pneumonia vaccine- Due  Past Medical History:  Diagnosis Date   Allergy    Arthritis    ?knees,hips    Cancer ([place])    prostate- robotic surgery, 2010.   Diabetes mellitus    diagnosed 2008   Hemorrhoids    h/o   Hypercholesteremia    Hypertension      Past Surgical History:  Procedure Laterality Date   ABDOMINAL HERNIA REPAIR  02/22/11   COLONOSCOPY  2018   HERNIA REPAIR  02/22/11   vental hernia   PROSTATECTOMY  October 15th, 2010   VENTRAL HERNIA REPAIR  02/22/2011   Procedure: LAPAROSCOPIC VENTRAL HERNIA;  Surgeon: Luella Cook III, MD;  Location: [facility];  Service: General;  Laterality: N/A;  laparoscopic ventral hernia repair with mesh    Medications  Current Outpatient Medications on File Prior to Visit  Medication Sig Dispense Refill   amLODipine (NORVASC) 10 MG tablet Take 1 tablet (10 mg total) by mouth daily. 90 tablet 2   atorvastatin (LIPITOR) 40 MG tablet TAKE ONE TABLET BY MOUTH ONE TIME DAILY 90 tablet 0   COVID-19 mRNA bivalent vaccine, Pfizer, (PFIZER COVID-19 VAC BIVALENT) injection Inject into the muscle. 0.3 mL 0   fluticasone (FLONASE) 50 MCG/ACT nasal spray Place 2 sprays into both nostrils daily. 48 g 3   losartan-hydrochlorothiazide (HYZAAR) 100-25 MG tablet Take 1 tablet by mouth daily. 90 tablet 2   metFORMIN (GLUCOPHAGE) 1000 MG tablet TAKE ONE TABLET BY MOUTH TWICE A DAY WITH MEALS 180 tablet 0   OVER THE COUNTER MEDICATION Take 1 tablet by mouth daily. Mega Red Joint     pioglitazone (ACTOS) 30 MG tablet  Take 1 tablet (30 mg total) by mouth daily. 30 tablet 2   potassium chloride SA (KLOR-CON M) 20 MEQ tablet TAKE ONE TABLET BY MOUTH ONE TIME DAILY 90 tablet 0   Semaglutide (RYBELSUS) 7 MG TABS Take 7 mg by mouth daily. 30 tablet 2   Turmeric (QC TUMERIC COMPLEX PO) Take by mouth.      Allergies No Known Allergies  Family History Family History  Problem Relation Age of Onset   Hypertension Mother    Diabetes Father    Hypertension Father    Anesthesia problems Neg Hx    Hypotension Neg Hx    Malignant hyperthermia Neg Hx    Pseudochol deficiency Neg Hx    Colon cancer Neg Hx    Colon polyps Neg Hx    Esophageal cancer Neg Hx    Rectal cancer Neg Hx    Stomach cancer Neg Hx     Review of Systems: Constitutional:  no fevers Eye:  no recent significant change in vision Ears:  No changes in hearing Nose/Mouth/Throat:  no complaints of nasal congestion, no sore throat Cardiovascular: no chest pain Respiratory:  No shortness of breath Gastrointestinal:  No change in bowel habits GU:  No frequency Integumentary:  no abnormal skin lesions reported Neurologic:  no headaches Endocrine:  denies unexplained weight changes  Exam BP 128/80  Pulse 75   Temp 97.9 F (36.6 C) (Oral)   Ht '5\' 8"'$  (1.727 m)   Wt 211 lb 2 oz (95.8 kg)   SpO2 97%   BMI 32.10 kg/m  General:  well developed, well nourished, in no apparent distress Skin:  no significant moles, warts, or growths Head:  no masses, lesions, or tenderness Eyes:  pupils equal and round, sclera anicteric without injection Ears:  canals without lesions, TMs shiny without retraction, no obvious effusion, no erythema Nose:  nares patent, septum midline, mucosa normal Throat/Pharynx:  lips and gingiva without lesion; tongue and uvula midline; non-inflamed pharynx; no exudates or postnasal drainage Lungs:  clear to auscultation, breath sounds equal bilaterally, no respiratory distress Cardio:  regular rate and rhythm, no LE  edema or bruits Rectal: Deferred GI: BS+, S, NT, ND, no masses or organomegaly Musculoskeletal:  symmetrical muscle groups noted without atrophy or deformity Neuro:  gait normal; deep tendon reflexes normal and symmetric Psych: well oriented with normal range of affect and appropriate judgment/insight  Assessment and Plan  Well adult exam - Plan: CBC, Comprehensive metabolic panel, Lipid panel  Diabetes mellitus type 2 in obese ([facility]) - Plan: Hemoglobin A1c, Microalbumin / creatinine urine ratio  Screening for prostate cancer - Plan: PSA  Essential hypertension - Plan: losartan-hydrochlorothiazide (HYZAAR) 100-25 MG tablet, amLODipine (NORVASC) 10 MG tablet  ACE-inhibitor cough  Need for vaccination against Streptococcus pneumoniae - Plan: Pneumococcal conjugate vaccine 20-valent (Prevnar 20)   Well 66 y.o. male. Counseled on diet and exercise. Other orders as above. Shingrix rec'd. PCV20 today. Advanced directive form requested today.  Follow up in 6 mo.  The patient voiced understanding and agreement to the plan.  [place], DO 10/06/21 11:29 AM

## 2021-10-06 NOTE — Patient Instructions (Signed)
Give us 2-3 business days to get the results of your labs back.   Keep the diet clean and stay active.  Please get me a copy of your advanced directive form at your convenience.   The Shingrix vaccine (for shingles) is a 2 shot series spaced 2-6 months apart. It can make people feel low energy, achy and almost like they have the flu for 48 hours after injection. 1/5 people can have nausea and/or vomiting. Please plan accordingly when deciding on when to get this shot. Call our office for a nurse visit appointment to get this. The second shot of the series is less severe regarding the side effects, but it still lasts 48 hours.   Let us know if you need anything.  

## 2021-11-16 ENCOUNTER — Other Ambulatory Visit (HOSPITAL_BASED_OUTPATIENT_CLINIC_OR_DEPARTMENT_OTHER): Payer: Self-pay

## 2021-11-16 MED ORDER — SHINGRIX 50 MCG/0.5ML IM SUSR
INTRAMUSCULAR | 0 refills | Status: DC
Start: 2021-11-16 — End: 2022-04-07
  Filled 2021-11-16: qty 0.5, 1d supply, fill #0

## 2021-11-25 ENCOUNTER — Other Ambulatory Visit (HOSPITAL_BASED_OUTPATIENT_CLINIC_OR_DEPARTMENT_OTHER): Payer: Self-pay

## 2021-12-14 ENCOUNTER — Other Ambulatory Visit: Payer: Self-pay | Admitting: Family Medicine

## 2021-12-21 ENCOUNTER — Other Ambulatory Visit: Payer: Self-pay | Admitting: Family Medicine

## 2021-12-22 ENCOUNTER — Other Ambulatory Visit: Payer: Self-pay | Admitting: Family Medicine

## 2022-03-15 ENCOUNTER — Other Ambulatory Visit (HOSPITAL_BASED_OUTPATIENT_CLINIC_OR_DEPARTMENT_OTHER): Payer: Self-pay

## 2022-03-15 MED ORDER — SHINGRIX 50 MCG/0.5ML IM SUSR
INTRAMUSCULAR | 0 refills | Status: DC
Start: 2022-03-15 — End: 2022-04-07
  Filled 2022-03-15: qty 0.5, 1d supply, fill #0

## 2022-03-21 ENCOUNTER — Other Ambulatory Visit: Payer: Self-pay | Admitting: Family Medicine

## 2022-03-21 DIAGNOSIS — I10 Essential (primary) hypertension: Secondary | ICD-10-CM

## 2022-03-24 ENCOUNTER — Other Ambulatory Visit: Payer: Self-pay | Admitting: Family Medicine

## 2022-03-30 ENCOUNTER — Other Ambulatory Visit: Payer: Self-pay | Admitting: Family Medicine

## 2022-04-07 ENCOUNTER — Encounter: Payer: Self-pay | Admitting: Family Medicine

## 2022-04-07 ENCOUNTER — Other Ambulatory Visit: Payer: Self-pay | Admitting: Family Medicine

## 2022-04-07 ENCOUNTER — Ambulatory Visit (INDEPENDENT_AMBULATORY_CARE_PROVIDER_SITE_OTHER): Payer: PPO | Admitting: Family Medicine

## 2022-04-07 VITALS — BP 138/85 | HR 93 | Temp 98.1°F | Ht 68.0 in | Wt 218.5 lb

## 2022-04-07 DIAGNOSIS — E669 Obesity, unspecified: Secondary | ICD-10-CM | POA: Diagnosis not present

## 2022-04-07 DIAGNOSIS — I10 Essential (primary) hypertension: Secondary | ICD-10-CM | POA: Diagnosis not present

## 2022-04-07 DIAGNOSIS — E1169 Type 2 diabetes mellitus with other specified complication: Secondary | ICD-10-CM | POA: Diagnosis not present

## 2022-04-07 LAB — COMPREHENSIVE METABOLIC PANEL
ALT: 11 U/L (ref 0–53)
AST: 17 U/L (ref 0–37)
Albumin: 4.5 g/dL (ref 3.5–5.2)
Alkaline Phosphatase: 62 U/L (ref 39–117)
BUN: 17 mg/dL (ref 6–23)
CO2: 30 mEq/L (ref 19–32)
Calcium: 9.5 mg/dL (ref 8.4–10.5)
Chloride: 99 mEq/L (ref 96–112)
Creatinine, Ser: 1.01 mg/dL (ref 0.40–1.50)
GFR: 77.91 mL/min (ref >60.00)
Glucose, Bld: 155 mg/dL — ABNORMAL HIGH (ref 70–99)
Potassium: 3.5 mEq/L (ref 3.5–5.1)
Sodium: 139 mEq/L (ref 135–145)
Total Bilirubin: 0.4 mg/dL (ref 0.2–1.2)
Total Protein: 7.8 g/dL (ref 6.0–8.3)

## 2022-04-07 LAB — LIPID PANEL
Cholesterol: 158 mg/dL (ref 0–200)
HDL: 37.6 mg/dL — ABNORMAL LOW (ref >39.00)
LDL Cholesterol: 95 mg/dL (ref 0–99)
NonHDL: 120.5
Total CHOL/HDL Ratio: 4
Triglycerides: 130 mg/dL (ref 0.0–149.0)
VLDL: 26 mg/dL (ref 0.0–40.0)

## 2022-04-07 LAB — HEMOGLOBIN A1C: Hgb A1c MFr Bld: 8.1 % — ABNORMAL HIGH (ref 4.6–6.5)

## 2022-04-07 MED ORDER — AMLODIPINE BESYLATE 10 MG PO TABS: 10.0000 mg | ORAL_TABLET | Freq: Every day | ORAL | 3 refills | Status: DC

## 2022-04-07 MED ORDER — ATORVASTATIN CALCIUM 40 MG PO TABS: 40.0000 mg | ORAL_TABLET | Freq: Every day | ORAL | 3 refills | Status: DC

## 2022-04-07 MED ORDER — GLYBURIDE 5 MG PO TABS: 5.0000 mg | ORAL_TABLET | Freq: Every day | ORAL | 4 refills | Status: DC

## 2022-04-07 MED ORDER — LOSARTAN POTASSIUM-HCTZ 100-25 MG PO TABS: 1.0000 | ORAL_TABLET | Freq: Every day | ORAL | 3 refills | Status: DC

## 2022-04-07 MED ORDER — POTASSIUM CHLORIDE CRYS ER 20 MEQ PO TBCR: 20.0000 meq | EXTENDED_RELEASE_TABLET | Freq: Every day | ORAL | 1 refills | Status: DC

## 2022-04-07 NOTE — Patient Instructions (Signed)
Give us 2-3 business days to get the results of your labs back.   Keep the diet clean and stay active.  Let us know if you need anything. 

## 2022-04-07 NOTE — Progress Notes (Signed)
Subjective:   Chief Complaint  Patient presents with   Follow-up    6 month    Kenneth [name] is a 67 y.o. male here for follow-up of diabetes.   Kenneth [name] does not routinely check his sugars.  Patient does not require insulin.   Medications include: Metformin 1000 mg bid Diet is OK2.  Exercise: none  Hypertension Patient presents for hypertension follow up. He does monitor home blood pressures. He is compliant with medications- Norvasc 10 mg/d, Hyzaar 100-25 mg/d. Patient has these side effects of medication: none Diet/exercise as above.  No CP or SOB.   Past Medical History:  Diagnosis Date   Allergy    Arthritis    ?knees,hips    Cancer [facility])    prostate- robotic surgery, 2010.   Diabetes mellitus    diagnosed 2008   Hemorrhoids    h/o   Hypercholesteremia    Hypertension      Related testing: Retinal exam: Done Pneumovax: done  Objective:  BP 138/85 (BP Location: Left Arm, Patient Position: Sitting, Cuff Size: Normal)   Pulse 93   Temp 98.1 F (36.7 C) (Oral)   Ht '5\' 8"'$  (1.727 m)   Wt 218 lb 8 oz (99.1 kg)   SpO2 97%   BMI 33.22 kg/m  General:  Well developed, well nourished, in no apparent distress Skin:  Warm, no pallor or diaphoresis Lungs:  CTAB, no access msc use Cardio:  RRR, no bruits, no LE edema Musculoskeletal:  Symmetrical muscle groups noted without atrophy or deformity Neuro:  Sensation intact to pinprick on feet Psych: Age appropriate judgment and insight  Assessment:   Diabetes mellitus type 2 in obese ([place]) - Plan: Comprehensive metabolic panel, Lipid panel, Hemoglobin A1c  Essential hypertension - Plan: amLODipine (NORVASC) 10 MG tablet, losartan-hydrochlorothiazide (HYZAAR) 100-25 MG tablet   Plan:   Chronic, hopefully stable. A1c goal is <8 for him. Cont metformin 1000 mg bid. Cont Lipitor. Counseled on diet and exercise. Chronic, stable. Cont Norvasc 10 mg/d, Hyzaar 100-25 mg/d.  F/u in 6 mo. The patient voiced understanding  and agreement to the plan.  [place], DO 04/07/22 7:33 AM

## 2022-06-16 ENCOUNTER — Other Ambulatory Visit: Payer: Self-pay | Admitting: Family Medicine

## 2022-07-07 ENCOUNTER — Ambulatory Visit (INDEPENDENT_AMBULATORY_CARE_PROVIDER_SITE_OTHER): Payer: PPO | Admitting: Family Medicine

## 2022-07-07 ENCOUNTER — Encounter: Payer: Self-pay | Admitting: Family Medicine

## 2022-07-07 VITALS — BP 108/72 | HR 89 | Temp 98.0°F | Ht 67.0 in | Wt 206.0 lb

## 2022-07-07 DIAGNOSIS — Z7984 Long term (current) use of oral hypoglycemic drugs: Secondary | ICD-10-CM

## 2022-07-07 DIAGNOSIS — E669 Obesity, unspecified: Secondary | ICD-10-CM

## 2022-07-07 DIAGNOSIS — E1169 Type 2 diabetes mellitus with other specified complication: Secondary | ICD-10-CM | POA: Diagnosis not present

## 2022-07-07 DIAGNOSIS — E119 Type 2 diabetes mellitus without complications: Secondary | ICD-10-CM | POA: Diagnosis not present

## 2022-07-07 LAB — HEMOGLOBIN A1C: Hgb A1c MFr Bld: 6.3 % (ref 4.6–6.5)

## 2022-07-07 NOTE — Progress Notes (Signed)
Subjective:   Chief Complaint  Patient presents with   Follow-up    Kenneth [name] is a 67 y.o. male here for follow-up of diabetes.   Dante's self monitored glucose range is 90-low 100's.  Patient denies hypoglycemic reactions. He checks his glucose levels 1 time every other day. Patient does not require insulin.   Medications include: metformin 1000 mg bid, glyburide 5 mg qd Diet is healthy.  Exercise: active around house, babysitting No chest pain or shortness of breath.  Past Medical History:  Diagnosis Date   Allergy    Arthritis    ?knees,hips    Cancer [facility])    prostate- robotic surgery, 2010.   Diabetes mellitus    diagnosed 2008   Hemorrhoids    h/o   Hypercholesteremia    Hypertension      Related testing: Retinal exam: DDue Pneumovax: done  Objective:  BP 108/72 (BP Location: Left Arm, Patient Position: Sitting, Cuff Size: Large)   Pulse 89   Temp 98 F (36.7 C) (Oral)   Ht 5\' 7"  (1.702 m)   Wt 206 lb (93.4 kg)   SpO2 95%   BMI 32.26 kg/m  General:  Well developed, well nourished, in no apparent distress Skin:  Warm, no pallor or diaphoresis on exposed skin surfaces Lungs:  CTAB, no access msc use Cardio:  RRR, no bruits, no LE edema Psych: Age appropriate judgment and insight  Assessment:   Type 2 diabetes mellitus with obesity ([facility]) - Plan: Hemoglobin A1c  Diabetes mellitus treated with oral medication ([facility])   Plan:   Chronic, hopefully controlled.  Continue metformin 1000 milligrams twice daily, glyburide 5 mg daily.  Monitor sugars to ensure that he is not having any hypoglycemia since he is losing weight.  Counseled on diet and exercise. F/u in 3-6 mo pending results. The patient voiced understanding and agreement to the plan.  Jilda Roche [place], DO 07/07/22 8:17 AM

## 2022-07-07 NOTE — Patient Instructions (Addendum)
Please schedule your eye exam.  Give [place] 2-3 business days to get the results of your labs back.   Keep the diet clean and stay active.  Strong work losing weight.   Let us know if you need anything.

## 2022-07-08 ENCOUNTER — Encounter: Payer: Self-pay | Admitting: Family Medicine

## 2022-07-10 ENCOUNTER — Other Ambulatory Visit: Payer: Self-pay | Admitting: Family Medicine

## 2022-07-10 MED ORDER — CONTOUR NEXT MONITOR W/DEVICE KIT: PACK | 0 refills | Status: AC

## 2022-07-10 MED ORDER — CONTOUR NEXT TEST VI STRP: ORAL_STRIP | 3 refills | Status: AC

## 2022-08-18 ENCOUNTER — Other Ambulatory Visit: Payer: Self-pay | Admitting: Family Medicine

## 2022-08-31 DIAGNOSIS — E119 Type 2 diabetes mellitus without complications: Secondary | ICD-10-CM | POA: Diagnosis not present

## 2022-09-16 ENCOUNTER — Other Ambulatory Visit: Payer: Self-pay | Admitting: Family Medicine

## 2022-09-22 ENCOUNTER — Other Ambulatory Visit: Payer: Self-pay | Admitting: Family Medicine

## 2022-10-09 ENCOUNTER — Encounter: Payer: PPO | Admitting: Family Medicine

## 2022-10-11 NOTE — Progress Notes (Signed)
Pt did not answer phone for AWV.   This encounter was created in error - please disregard.

## 2022-10-13 ENCOUNTER — Other Ambulatory Visit: Payer: Self-pay | Admitting: Family Medicine

## 2022-10-27 ENCOUNTER — Other Ambulatory Visit: Payer: Self-pay | Admitting: Pharmacist

## 2022-10-27 ENCOUNTER — Encounter: Payer: Self-pay | Admitting: Pharmacist

## 2022-10-27 MED ORDER — GLYBURIDE 5 MG PO TABS: 5.0000 mg | ORAL_TABLET | Freq: Every day | ORAL | 1 refills | Status: DC

## 2022-10-27 NOTE — Progress Notes (Signed)
Pharmacy Quality Measure Review  This patient is appearing on a report for being at risk of failing the adherence measure for cholesterol (statin) and diabetes medications this calendar year.    This patient is also appearing on report for being at risk of failing the measure for Statin Therapy for Patients with Cardiovascular Disease [facility]) medications this calendar year because last RF for atorvastatin was 02/2022.   Medication: atorvastatin 40mg  Last fill date: per [facility] they are not filling on insurance. Verified they do have prescription insurance on file. Cost for atorvastatin with his HTA benefit is  $0 for the whole year.   Per [facility] they filled off insurance 09/18/2022 for 90 DS. They will fill with his insurance going forward.   Medication: glyburide  Last fill date: 09/18/2022 for 30 day supply  I spoke with patient and explained that he has HTA Heart and Diabetes plan which means that the copay for all his medications related to diabetes, hypertension or cholesterol will be $0 (unless he has a tier 3 medication and reaches coverage gap). All the meds on his current med list with the exception of potassium supplement would be $0 for the full year.  He will start utilizing his HTA pharmacy benefits instead of using [facility] discount.   Also sent updated Rx for glyburide 90 DS to [facility]  Noted patient has daibetic eye exam 08/31/2022 with optometrist with [facility]. Sent message to Ashok Norris to abstract and updated Health Maintenance.   Henrene Pastor, PharmD Clinical Pharmacist [facility]  Population Health [phone]

## 2022-12-12 ENCOUNTER — Other Ambulatory Visit: Payer: Self-pay | Admitting: Family Medicine

## 2022-12-19 ENCOUNTER — Other Ambulatory Visit: Payer: Self-pay | Admitting: Family Medicine

## 2022-12-24 ENCOUNTER — Emergency Department (HOSPITAL_BASED_OUTPATIENT_CLINIC_OR_DEPARTMENT_OTHER): Payer: PPO

## 2022-12-24 ENCOUNTER — Other Ambulatory Visit: Payer: Self-pay

## 2022-12-24 ENCOUNTER — Emergency Department (HOSPITAL_BASED_OUTPATIENT_CLINIC_OR_DEPARTMENT_OTHER)
Admission: EM | Admit: 2022-12-24 | Discharge: 2022-12-24 | Disposition: A | Discharge status: Home / Self Care | Payer: PPO | Attending: Emergency Medicine | Admitting: Emergency Medicine

## 2022-12-24 ENCOUNTER — Encounter (HOSPITAL_BASED_OUTPATIENT_CLINIC_OR_DEPARTMENT_OTHER): Payer: Self-pay

## 2022-12-24 DIAGNOSIS — Z7984 Long term (current) use of oral hypoglycemic drugs: Secondary | ICD-10-CM | POA: Diagnosis not present

## 2022-12-24 DIAGNOSIS — Z79899 Other long term (current) drug therapy: Secondary | ICD-10-CM | POA: Diagnosis not present

## 2022-12-24 DIAGNOSIS — K5792 Diverticulitis of intestine, part unspecified, without perforation or abscess without bleeding: Secondary | ICD-10-CM

## 2022-12-24 DIAGNOSIS — K5730 Diverticulosis of large intestine without perforation or abscess without bleeding: Secondary | ICD-10-CM | POA: Diagnosis not present

## 2022-12-24 DIAGNOSIS — R109 Unspecified abdominal pain: Secondary | ICD-10-CM | POA: Diagnosis present

## 2022-12-24 DIAGNOSIS — K5732 Diverticulitis of large intestine without perforation or abscess without bleeding: Secondary | ICD-10-CM | POA: Diagnosis not present

## 2022-12-24 DIAGNOSIS — E119 Type 2 diabetes mellitus without complications: Secondary | ICD-10-CM | POA: Insufficient documentation

## 2022-12-24 DIAGNOSIS — I10 Essential (primary) hypertension: Secondary | ICD-10-CM | POA: Insufficient documentation

## 2022-12-24 DIAGNOSIS — K8020 Calculus of gallbladder without cholecystitis without obstruction: Secondary | ICD-10-CM | POA: Diagnosis not present

## 2022-12-24 DIAGNOSIS — K439 Ventral hernia without obstruction or gangrene: Secondary | ICD-10-CM | POA: Diagnosis not present

## 2022-12-24 DIAGNOSIS — N2889 Other specified disorders of kidney and ureter: Secondary | ICD-10-CM | POA: Diagnosis not present

## 2022-12-24 LAB — URINALYSIS, ROUTINE W REFLEX MICROSCOPIC
Bilirubin Urine: NEGATIVE
Glucose, UA: NEGATIVE mg/dL
Hgb urine dipstick: NEGATIVE
Ketones, ur: NEGATIVE mg/dL
Leukocytes,Ua: NEGATIVE
Nitrite: NEGATIVE
Protein, ur: NEGATIVE mg/dL
Specific Gravity, Urine: 1.015 (ref 1.005–1.030)
pH: 6.5 (ref 5.0–8.0)

## 2022-12-24 LAB — COMPREHENSIVE METABOLIC PANEL
ALT: 10 U/L (ref 0–44)
AST: 21 U/L (ref 15–41)
Albumin: 4.5 g/dL (ref 3.5–5.0)
Alkaline Phosphatase: 58 U/L (ref 38–126)
Anion gap: 12 (ref 5–15)
BUN: 17 mg/dL (ref 8–23)
CO2: 29 mmol/L (ref 22–32)
Calcium: 9.1 mg/dL (ref 8.9–10.3)
Chloride: 97 mmol/L — ABNORMAL LOW (ref 98–111)
Creatinine, Ser: 1.02 mg/dL (ref 0.61–1.24)
GFR, Estimated: >60 mL/min (ref >60)
Glucose, Bld: 117 mg/dL — ABNORMAL HIGH (ref 70–99)
Potassium: 3.4 mmol/L — ABNORMAL LOW (ref 3.5–5.1)
Sodium: 138 mmol/L (ref 135–145)
Total Bilirubin: 0.7 mg/dL (ref 0.3–1.2)
Total Protein: 8.3 g/dL — ABNORMAL HIGH (ref 6.5–8.1)

## 2022-12-24 LAB — CBC
HCT: 38.4 % — ABNORMAL LOW (ref 39.0–52.0)
Hemoglobin: 12.6 g/dL — ABNORMAL LOW (ref 13.0–17.0)
MCH: 27.6 pg (ref 26.0–34.0)
MCHC: 32.8 g/dL (ref 30.0–36.0)
MCV: 84 fL (ref 80.0–100.0)
Platelets: 267 10*3/uL (ref 150–400)
RBC: 4.57 MIL/uL (ref 4.22–5.81)
RDW: 14.6 % (ref 11.5–15.5)
WBC: 7.3 10*3/uL (ref 4.0–10.5)
nRBC: 0 % (ref 0.0–0.2)

## 2022-12-24 LAB — LIPASE, BLOOD: Lipase: 35 U/L (ref 11–51)

## 2022-12-24 MED ORDER — AMOXICILLIN-POT CLAVULANATE 875-125 MG PO TABS: 1.0000 | ORAL_TABLET | Freq: Two times a day (BID) | ORAL | 0 refills | Status: DC

## 2022-12-24 MED ORDER — IOHEXOL 300 MG/ML  SOLN
100.0000 mL | Freq: Once | INTRAMUSCULAR | Status: AC | PRN
  Administered 2022-12-24: 100 mL via INTRAVENOUS

## 2022-12-24 NOTE — ED Provider Notes (Signed)
[place] [facility] Provider Note   CSN: [id number] Arrival date & time: 12/24/22  4098     History  Chief Complaint  Patient presents with   Abdominal Pain    Kenneth [name] is a 67 y.o. male.  Patient is a 67 year old male with a history of diabetes, hypertension, hyperlipidemia, prior prostate cancer, gout who presents with some abdominal pain.  He says it started yesterday evening.  It is in the left side.  It is nonradiating.  He had some nausea this morning but no vomiting.  No change in stools.  No urinary symptoms.  No known fevers.  No falls or injuries to the area.  It is not worse with movement.  No history of similar symptoms in the past.       Home Medications Prior to Admission medications   Medication Sig Start Date End Date Taking? Authorizing Provider  amoxicillin-clavulanate (AUGMENTIN) 875-125 MG tablet Take 1 tablet by mouth every 12 (twelve) hours. 12/24/22  Yes Rolan Bucco, MD  amLODipine (NORVASC) 10 MG tablet Take 1 tablet (10 mg total) by mouth daily. 04/07/22   Sharlene Dory, DO  atorvastatin (LIPITOR) 40 MG tablet Take 1 tablet (40 mg total) by mouth daily. 04/07/22   Sharlene Dory, DO  Blood Glucose Monitoring Suppl (CONTOUR NEXT MONITOR) w/Device KIT Use daily to check blood sugar.  DX E11.65 07/10/22   Sharlene Dory, DO  COVID-19 mRNA bivalent vaccine, Pfizer, (PFIZER COVID-19 VAC BIVALENT) injection Inject into the muscle. 03/30/21   Judyann Munson, MD  fluticasone (FLONASE) 50 MCG/ACT nasal spray Place 2 sprays into both nostrils daily. 10/06/21   Sharlene Dory, DO  glucose blood (CONTOUR NEXT TEST) test strip Use daily to check blood sugar.  DX E11.65 07/10/22   Sharlene Dory, DO  glyBURIDE (DIABETA) 5 MG tablet Take 1 tablet (5 mg total) by mouth daily with breakfast. 10/27/22   Wendling, Jilda Roche, DO  losartan-hydrochlorothiazide (HYZAAR) 100-25 MG tablet  Take 1 tablet by mouth daily. 04/07/22   Sharlene Dory, DO  metFORMIN (GLUCOPHAGE) 1000 MG tablet TAKE ONE TABLET BY MOUTH TWICE DAILY WITH MEALS 12/12/22   Wendling, Jilda Roche, DO  OVER THE COUNTER MEDICATION Take 1 tablet by mouth daily. Mega Red Joint    [provider]  potassium chloride SA (KLOR-CON M) 20 MEQ tablet TAKE ONE TABLET BY MOUTH ONE TIME DAILY 12/19/22   Carmelia Roller, Jilda Roche, DO  Turmeric (QC TUMERIC COMPLEX PO) Take by mouth.    [provider]      Allergies    Patient has no known allergies.    Review of Systems   Review of Systems  Constitutional:  Negative for chills, diaphoresis, fatigue and fever.  HENT:  Negative for congestion, rhinorrhea and sneezing.   Eyes: Negative.   Respiratory:  Negative for cough, chest tightness and shortness of breath.   Cardiovascular:  Negative for chest pain and leg swelling.  Gastrointestinal:  Positive for abdominal pain and nausea. Negative for blood in stool, diarrhea and vomiting.  Genitourinary:  Negative for difficulty urinating, flank pain, frequency and hematuria.  Musculoskeletal:  Negative for arthralgias and back pain.  Skin:  Negative for rash.  Neurological:  Negative for dizziness, speech difficulty, weakness, numbness and headaches.    Physical Exam Updated Vital Signs BP 137/89   Pulse 96   Temp 98.2 F (36.8 C)   Resp 18   Ht 5\' 8"  (1.727  m)   Wt 92.5 kg   SpO2 98%   BMI 31.02 kg/m  Physical Exam Constitutional:      Appearance: He is well-developed.  HENT:     Head: Normocephalic and atraumatic.  Eyes:     Pupils: Pupils are equal, round, and reactive to light.  Cardiovascular:     Rate and Rhythm: Normal rate and regular rhythm.     Heart sounds: Normal heart sounds.  Pulmonary:     Effort: Pulmonary effort is normal. No respiratory distress.     Breath sounds: Normal breath sounds. No wheezing or rales.  Chest:     Chest wall: No tenderness.  Abdominal:      General: Bowel sounds are normal.     Palpations: Abdomen is soft.     Tenderness: There is abdominal tenderness in the left lower quadrant. There is no guarding or rebound.  Musculoskeletal:        General: Normal range of motion.     Cervical back: Normal range of motion and neck supple.  Lymphadenopathy:     Cervical: No cervical adenopathy.  Skin:    General: Skin is warm and dry.     Findings: No rash.  Neurological:     Mental Status: He is alert and oriented to person, place, and time.     ED Results / Procedures / Treatments   Labs (all labs ordered are listed, but only abnormal results are displayed) Labs Reviewed  COMPREHENSIVE METABOLIC PANEL - Abnormal; Notable for the following components:      Result Value   Potassium 3.4 (*)    Chloride 97 (*)    Glucose, Bld 117 (*)    Total Protein 8.3 (*)    All other components within normal limits  CBC - Abnormal; Notable for the following components:   Hemoglobin 12.6 (*)    HCT 38.4 (*)    All other components within normal limits  LIPASE, BLOOD  URINALYSIS, ROUTINE W REFLEX MICROSCOPIC    EKG EKG Interpretation Date/Time:  Sunday December 24 2022 08:34:40 EDT Ventricular Rate:  97 PR Interval:  164 QRS Duration:  88 QT Interval:  375 QTC Calculation: 477 R Axis:   -9  Text Interpretation: Sinus rhythm Low voltage, precordial leads Abnormal R-wave progression, early transition Abnormal T, consider ischemia, inferior leads Baseline wander in lead(s) II III aVF Since last tracing Confirmed by Rolan Bucco [phone]) on 12/24/2022 9:25:42 AM  Radiology CT ABDOMEN PELVIS W CONTRAST  Result Date: 12/24/2022 CLINICAL DATA:  Left lower quadrant abdominal pain EXAM: CT ABDOMEN AND PELVIS WITH CONTRAST TECHNIQUE: Multidetector CT imaging of the abdomen and pelvis was performed using the standard protocol following bolus administration of intravenous contrast. RADIATION DOSE REDUCTION: This exam was performed according to  the departmental dose-optimization program which includes automated exposure control, adjustment of the mA and/or kV according to patient size and/or use of iterative reconstruction technique. CONTRAST:  OMNIPAQUE IOHEXOL 300 MG/ML  SOLN COMPARISON:  MRI of the abdomen 09/03/2009 FINDINGS: Lower chest: No acute abnormality. Hepatobiliary: Normal hepatic contour and morphology. There are several tiny subcentimeter low-attenuation lesions scattered throughout the liver. While too small to characterize, these are statistically highly likely benign cysts. Small stones present in the gallbladder neck. No gallbladder distension or wall thickening. Pancreas: Unremarkable. No pancreatic ductal dilatation or surrounding inflammatory changes. Spleen: Normal in size without focal abnormality. Adrenals/Urinary Tract: Normal adrenal glands. Chronic 3.5 x 3.1 cm complex cyst with peripheral calcifications in the upper  pole of the right kidney. This was previously evaluated on MRI in 2011 and found to represent a nonenhancing complex cyst. No significant change comparing across modalities. Greater than 10 years of stability is highly consistent with a benign process. No hydronephrosis, nephrolithiasis or enhancing lesion. Stomach/Bowel: Descending and sigmoid colonic diverticulosis. At the junction of the descending and proximal sigmoid colon in the left lower quadrant there is focal submucosal wall thickening with fairly extensive inflammatory stranding in the pericolonic fat. No evidence of free air or localized abscess. Findings are consistent with acute uncomplicated diverticulitis. Normal appendix in the right lower quadrant. The stomach and duodenum are unremarkable. No evidence of bowel obstruction. Vascular/Lymphatic: No significant vascular findings are present. No enlarged abdominal or pelvic lymph nodes. Reproductive: Surgical changes of prior prostatectomy. Other: Fat containing ventral abdominal hernia just  superior to the umbilicus. Musculoskeletal: No acute fracture or aggressive appearing lytic or blastic osseous lesion. Mild lower lumbar facet arthropathy and degenerative disc disease. IMPRESSION: 1. CT findings are consistent with acute uncomplicated sigmoid diverticulitis. 2. Cholelithiasis. 3. Supraumbilical fat containing ventral hernia. 4. Mild lower lumbar degenerative disc disease and facet arthropathy. 5. Benign complex cystic lesion in the upper pole of the right kidney remains unchanged dating back to 2011. No imaging follow-up is recommended. Electronically Signed   By: Malachy [name] M.D.   On: 12/24/2022 10:35    Procedures Procedures    Medications Ordered in ED Medications  iohexol (OMNIPAQUE) 300 MG/ML solution 100 mL (100 mLs Intravenous Contrast Given 12/24/22 0946)    ED Course/ Medical Decision Making/ A&P                                 Medical Decision Making Amount and/or Complexity of Data Reviewed Labs: ordered. Radiology: ordered.  Risk Prescription drug management.   Patient is a 67 year old male who presents with left lower quadrant abdominal pain.  Labs reviewed and are nonconcerning.  No evidence of pancreatitis.  No evidence of urinary tract infection.  His CT scan shows evidence of uncomplicated diverticulitis.  No evidence of perforation or abscess.  Patient is otherwise well-appearing.  He has not required any ongoing medication for pain.  He was discharged home in good condition.  He was given a prescription for Augmentin.  He was encouraged to have close follow-up with his primary care doctor.  Return precautions were given.  Final Clinical Impression(s) / ED Diagnoses Final diagnoses:  Diverticulitis    Rx / DC Orders ED Discharge Orders          Ordered    amoxicillin-clavulanate (AUGMENTIN) 875-125 MG tablet  Every 12 hours        12/24/22 1110              Rolan Bucco, MD 12/24/22 1110

## 2022-12-24 NOTE — ED Triage Notes (Signed)
Pt reports left side pain x 1 day. Denies N/V/D. No urinary symptoms. Pt thought possible pulled muscle

## 2022-12-24 NOTE — Discharge Instructions (Signed)
Maintain a clear liquid diet only for the next few days.  After that you can start introducing foods but try to avoid heavy or fatty foods.  Also maintain lower fiber foods until your symptoms resolve.  Once your symptoms have completely resolved, you actually want to take a daily fiber supplement and increase the fiber in your diet to help reduce more outbreaks.  Follow-up with your primary care doctor within the next few days.  Return to emergency room if you have any worsening symptoms.

## 2023-01-08 ENCOUNTER — Encounter: Payer: PPO | Admitting: Family Medicine

## 2023-01-15 ENCOUNTER — Encounter: Payer: Self-pay | Admitting: Family Medicine

## 2023-01-15 ENCOUNTER — Ambulatory Visit: Payer: PPO | Admitting: Family Medicine

## 2023-01-15 VITALS — BP 130/80 | HR 100 | Temp 98.0°F | Resp 16 | Ht 68.0 in | Wt 212.2 lb

## 2023-01-15 DIAGNOSIS — Z7984 Long term (current) use of oral hypoglycemic drugs: Secondary | ICD-10-CM | POA: Diagnosis not present

## 2023-01-15 DIAGNOSIS — E119 Type 2 diabetes mellitus without complications: Secondary | ICD-10-CM | POA: Diagnosis not present

## 2023-01-15 DIAGNOSIS — Z0000 Encounter for general adult medical examination without abnormal findings: Secondary | ICD-10-CM

## 2023-01-15 DIAGNOSIS — Z23 Encounter for immunization: Secondary | ICD-10-CM

## 2023-01-15 LAB — MICROALBUMIN / CREATININE URINE RATIO
Creatinine,U: 131.7 mg/dL
Microalb Creat Ratio: 1.8 mg/g (ref 0.0–30.0)
Microalb, Ur: 2.4 mg/dL — ABNORMAL HIGH (ref 0.0–1.9)

## 2023-01-15 LAB — LIPID PANEL
Cholesterol: 142 mg/dL (ref 0–200)
HDL: 33.8 mg/dL — ABNORMAL LOW (ref >39.00)
LDL Cholesterol: 90 mg/dL (ref 0–99)
NonHDL: 107.93
Total CHOL/HDL Ratio: 4
Triglycerides: 88 mg/dL (ref 0.0–149.0)
VLDL: 17.6 mg/dL (ref 0.0–40.0)

## 2023-01-15 LAB — COMPREHENSIVE METABOLIC PANEL
ALT: 11 U/L (ref 0–53)
AST: 18 U/L (ref 0–37)
Albumin: 4.4 g/dL (ref 3.5–5.2)
Alkaline Phosphatase: 64 U/L (ref 39–117)
BUN: 20 mg/dL (ref 6–23)
CO2: 29 meq/L (ref 19–32)
Calcium: 9.6 mg/dL (ref 8.4–10.5)
Chloride: 100 meq/L (ref 96–112)
Creatinine, Ser: 1.12 mg/dL (ref 0.40–1.50)
GFR: 68.45 mL/min (ref >60.00)
Glucose, Bld: 89 mg/dL (ref 70–99)
Potassium: 4.1 meq/L (ref 3.5–5.1)
Sodium: 139 meq/L (ref 135–145)
Total Bilirubin: 0.4 mg/dL (ref 0.2–1.2)
Total Protein: 7.9 g/dL (ref 6.0–8.3)

## 2023-01-15 LAB — CBC
HCT: 38.2 % — ABNORMAL LOW (ref 39.0–52.0)
Hemoglobin: 12.5 g/dL — ABNORMAL LOW (ref 13.0–17.0)
MCHC: 32.6 g/dL (ref 30.0–36.0)
MCV: 84.1 fL (ref 78.0–100.0)
Platelets: 351 10*3/uL (ref 150.0–400.0)
RBC: 4.54 Mil/uL (ref 4.22–5.81)
RDW: 14.9 % (ref 11.5–15.5)
WBC: 4.6 10*3/uL (ref 4.0–10.5)

## 2023-01-15 LAB — HEMOGLOBIN A1C: Hgb A1c MFr Bld: 6.3 % (ref 4.6–6.5)

## 2023-01-15 NOTE — Patient Instructions (Signed)
Give us 2-3 business days to get the results of your labs back.   Keep the diet clean and stay active.  Please get me a copy of your advanced directive form at your convenience.   Let us know if you need anything.  

## 2023-01-15 NOTE — Progress Notes (Signed)
Chief Complaint  Patient presents with   Annual Exam    Annual Exam    Well Male Kenneth [name] is here for a complete physical.   His last physical was >1 year ago.  Current diet: in general, a "healthy" diet.   Current exercise: active w granddaughter Weight trend: up a little Fatigue out of ordinary? No. Seat belt? Yes.   Advanced directive? Yes  Health maintenance Shingrix- Yes Colonoscopy- Yes Tetanus- Yes Hep C- Yes Pneumonia vaccine- Yes  Past Medical History:  Diagnosis Date   Allergy    Arthritis    ?knees,hips    Cancer (HCC)    prostate- robotic surgery, 2010.   Diabetes mellitus    diagnosed 2008   Hemorrhoids    h/o   Hypercholesteremia    Hypertension      Past Surgical History:  Procedure Laterality Date   ABDOMINAL HERNIA REPAIR  02/22/11   COLONOSCOPY  2018   HERNIA REPAIR  02/22/11   vental hernia   PROSTATECTOMY  October 15th, 2010   VENTRAL HERNIA REPAIR  02/22/2011   Procedure: LAPAROSCOPIC VENTRAL HERNIA;  Surgeon: Caleen Essex III, MD;  Location: [facility] OR;  Service: General;  Laterality: N/A;  laparoscopic ventral hernia repair with mesh    Medications  Current Outpatient Medications on File Prior to Visit  Medication Sig Dispense Refill   amLODipine (NORVASC) 10 MG tablet Take 1 tablet (10 mg total) by mouth daily. 90 tablet 3   amoxicillin-clavulanate (AUGMENTIN) 875-125 MG tablet Take 1 tablet by mouth every 12 (twelve) hours. 14 tablet 0   atorvastatin (LIPITOR) 40 MG tablet Take 1 tablet (40 mg total) by mouth daily. 90 tablet 3   Blood Glucose Monitoring Suppl (CONTOUR NEXT MONITOR) w/Device KIT Use daily to check blood sugar.  DX E11.65 1 kit 0   COVID-19 mRNA bivalent vaccine, Pfizer, (PFIZER COVID-19 VAC BIVALENT) injection Inject into the muscle. 0.3 mL 0   fluticasone (FLONASE) 50 MCG/ACT nasal spray Place 2 sprays into both nostrils daily. 48 g 3   glucose blood (CONTOUR NEXT TEST) test strip Use daily to check blood sugar.  DX  E11.65 100 each 3   glyBURIDE (DIABETA) 5 MG tablet Take 1 tablet (5 mg total) by mouth daily with breakfast. 90 tablet 1   losartan-hydrochlorothiazide (HYZAAR) 100-25 MG tablet Take 1 tablet by mouth daily. 90 tablet 3   metFORMIN (GLUCOPHAGE) 1000 MG tablet TAKE ONE TABLET BY MOUTH TWICE DAILY WITH MEALS 180 tablet 0   OVER THE COUNTER MEDICATION Take 1 tablet by mouth daily. Mega Red Joint     potassium chloride SA (KLOR-CON M) 20 MEQ tablet TAKE ONE TABLET BY MOUTH ONE TIME DAILY 90 tablet 0   Turmeric (QC TUMERIC COMPLEX PO) Take by mouth.      Allergies No Known Allergies  Family History Family History  Problem Relation Age of Onset   Hypertension Mother    Diabetes Father    Hypertension Father    Anesthesia problems Neg Hx    Hypotension Neg Hx    Malignant hyperthermia Neg Hx    Pseudochol deficiency Neg Hx    Colon cancer Neg Hx    Colon polyps Neg Hx    Esophageal cancer Neg Hx    Rectal cancer Neg Hx    Stomach cancer Neg Hx     Review of Systems: Constitutional:  no fevers Eye:  no recent significant change in vision Ears:  No changes in hearing Nose/Mouth/Throat:  no complaints of nasal congestion, no sore throat Cardiovascular: no chest pain Respiratory:  No shortness of breath Gastrointestinal:  No change in bowel habits GU:  No frequency Integumentary:  no abnormal skin lesions reported Neurologic:  no headaches Endocrine:  denies unexplained weight changes  Exam BP 130/80 (BP Location: Left Arm, Patient Position: Sitting, Cuff Size: Normal)   Pulse 100   Temp 98 F (36.7 C) (Oral)   Resp 16   Ht 5\' 8"  (1.727 m)   Wt 212 lb 3.2 oz (96.3 kg)   SpO2 97%   BMI 32.26 kg/m  General:  well developed, well nourished, in no apparent distress Skin:  no significant moles, warts, or growths Head:  no masses, lesions, or tenderness Eyes:  pupils equal and round, sclera anicteric without injection Ears:  canals without lesions, TMs shiny without  retraction, no obvious effusion, no erythema Nose:  nares patent, mucosa normal Throat/Pharynx:  lips and gingiva without lesion; tongue and uvula midline; non-inflamed pharynx; no exudates or postnasal drainage Lungs:  clear to auscultation, breath sounds equal bilaterally, no respiratory distress Cardio:  regular rate and rhythm, no LE edema or bruits Rectal: Deferred GI: BS+, S, NT, ND, no masses or organomegaly Musculoskeletal:  symmetrical muscle groups noted without atrophy or deformity Neuro:  gait normal; deep tendon reflexes normal and symmetric Psych: well oriented with normal range of affect and appropriate judgment/insight  Assessment and Plan  Well adult exam  Need for influenza vaccination - Plan: Flu Vaccine Trivalent High Dose (Fluad)  Diabetes mellitus treated with oral medication (HCC) - Plan: Comprehensive metabolic panel, CBC, Microalbumin / creatinine urine ratio, Lipid panel, Hemoglobin A1c   Well 67 y.o. male. Counseled on diet and exercise. Other orders as above. Advanced directive form requested today.  Flu shot today.  Follow up in 6 mo.  The patient voiced understanding and agreement to the plan.  Jilda Roche [place], DO 01/15/23 9:16 AM

## 2023-01-24 ENCOUNTER — Other Ambulatory Visit: Payer: Self-pay | Admitting: Family Medicine

## 2023-01-25 ENCOUNTER — Encounter: Payer: Self-pay | Admitting: Pharmacist

## 2023-01-25 NOTE — Progress Notes (Signed)
Pharmacy Quality Measure Review  This patient is appearing on a report for being at risk of failing the adherence measure for cholesterol (statin) and diabetes medications this calendar year.    This patient is also appearing on report for being at risk of failing the measure for Statin Therapy for Patients with Cardiovascular Disease [facility]) medications this calendar year because last RF for atorvastatin was 02/2022.   Medication: atorvastatin 40mg   Patient previously was not filling atorvastatin on HTA plan because it thought it would be costly and that he might reach coverage gap. In August explained that all his current meds are generic and he is unlikely to reach coverage gap. Also cost for atorvastatin with his HTA benefit is  $0 for the whole year even during coverage gap period if he were to reach it.   Called [facility] today - patient has filled atorvastatin for 90 DS 10/27/2022 and 01/24/2023 on HTA plan and cost was $0 Should pass SUPD measure for 2024.   Medication: glyburide  Fill dates: 2/21, 3/18, 4/16, 5/11, 7/8 and 08/02 for 30 day supply Filled last for 90 DS on 12/11/2022 Medication metformin 07/08 and 10/11 - filled 90 day supply Should pass med adherence measure for diabetes and cholesterol medication for 2024.   Henrene Pastor, PharmD Clinical Pharmacist [facility]  Population Health [phone]

## 2023-03-16 ENCOUNTER — Other Ambulatory Visit: Payer: Self-pay | Admitting: Family Medicine

## 2023-04-09 ENCOUNTER — Other Ambulatory Visit: Payer: Self-pay | Admitting: Family Medicine

## 2023-04-26 ENCOUNTER — Other Ambulatory Visit: Payer: Self-pay | Admitting: Family Medicine

## 2023-06-05 ENCOUNTER — Other Ambulatory Visit: Payer: Self-pay | Admitting: Family Medicine

## 2023-07-10 ENCOUNTER — Ambulatory Visit (INDEPENDENT_AMBULATORY_CARE_PROVIDER_SITE_OTHER)

## 2023-07-10 VITALS — Ht 68.0 in | Wt 212.0 lb

## 2023-07-10 DIAGNOSIS — Z0000 Encounter for general adult medical examination without abnormal findings: Secondary | ICD-10-CM | POA: Diagnosis not present

## 2023-07-10 NOTE — Patient Instructions (Signed)
 Kenneth [name] , Thank you for taking time to come for your Medicare Wellness Visit. I appreciate your ongoing commitment to your health goals. Please review the following plan we discussed and let me know if I can assist you in the future.   Referrals/Orders/Follow-Ups/Clinician Recommendations: Aim for 30 minutes of exercise or brisk walking, 6-8 glasses of water, and 5 servings of fruits and vegetables each day.  This is a list of the screening recommended for you and due dates:  Health Maintenance  Topic Date Due   Complete foot exam   04/08/2023   COVID-19 Vaccine (5 - 2024-25 season) 07/15/2023*   Hemoglobin A1C  07/15/2023   Eye exam for diabetics  09/16/2023   Flu Shot  10/12/2023   Yearly kidney function blood test for diabetes  01/15/2024   Yearly kidney health urinalysis for diabetes  01/15/2024   Medicare Annual Wellness Visit  07/09/2024   DTaP/Tdap/Td vaccine (3 - Td or Tdap) 08/18/2029   Colon Cancer Screening  07/20/2030   Pneumonia Vaccine  Completed   Hepatitis C Screening  Completed   Zoster (Shingles) Vaccine  Completed   HPV Vaccine  Aged Out   Meningitis B Vaccine  Aged Out  *Topic was postponed. The date shown is not the original due date.    Advanced directives: (ACP Link)Information on Advanced Care Planning can be found at [place]  Secretary of [facility] Advance Health Care Directives Advance Health Care Directives. [email or web address]   Next Medicare Annual Wellness Visit scheduled for next year: Yes

## 2023-07-10 NOTE — Progress Notes (Signed)
 Subjective:   Kenneth [name] is a 68 y.o. who presents for a Medicare Wellness preventive visit.  Visit Complete: Virtual I connected with  Kenneth [name] on 07/10/23 by a audio enabled telemedicine application and verified that I am speaking with the correct person using two identifiers.  Patient Location: Home  Provider Location: Home Office  I discussed the limitations of evaluation and management by telemedicine. The patient expressed understanding and agreed to proceed.  Vital Signs: Because this visit was a virtual/telehealth visit, some criteria may be missing or patient reported. Any vitals not documented were not able to be obtained and vitals that have been documented are patient reported.  VideoDeclined- This patient declined Librarian, academic. Therefore the visit was completed with audio only.  Persons Participating in Visit: Patient.  AWV Questionnaire: No: Patient Medicare AWV questionnaire was not completed prior to this visit.  Cardiac Risk Factors include: advanced age (>78men, >66 women);diabetes mellitus;male gender;hypertension;dyslipidemia     Objective:    Today's Vitals   07/10/23 1218  Weight: 212 lb (96.2 kg)  Height: 5\' 8"  (1.727 m)   Body mass index is 32.23 kg/m.     07/10/2023   12:20 PM 12/24/2022    8:32 AM 12/19/2016    8:24 AM 12/06/2016    8:44 AM 02/22/2011    9:24 PM 02/20/2011    9:22 AM  Advanced Directives  Does Patient Have a Medical Advance Directive? No No No No Patient has advance directive, copy not in chart Patient has advance directive, copy not in chart  Type of Advance Directive     Healthcare Power of Attorney Living will  Copy of Healthcare Power of Attorney in Chart?     Copy requested from family   Would patient like information on creating a medical advance directive? Yes (MAU/Ambulatory/Procedural Areas - Information given)         Current Medications (verified) Outpatient Encounter  Medications as of 07/10/2023  Medication Sig   amLODipine  (NORVASC ) 10 MG tablet Take 1 tablet (10 mg total) by mouth daily.   atorvastatin  (LIPITOR) 40 MG tablet TAKE ONE TABLET BY MOUTH ONE TIME DAILY   Blood Glucose Monitoring Suppl (CONTOUR NEXT MONITOR) w/Device KIT Use daily to check blood sugar.  DX E11.65   fluticasone  (FLONASE ) 50 MCG/ACT nasal spray Place 2 sprays into both nostrils daily.   glucose blood (CONTOUR NEXT TEST) test strip Use daily to check blood sugar.  DX E11.65   glyBURIDE  (DIABETA ) 5 MG tablet Take 1 tablet (5 mg total) by mouth daily with breakfast.   losartan -hydrochlorothiazide  (HYZAAR) 100-25 MG tablet Take 1 tablet by mouth daily.   metFORMIN  (GLUCOPHAGE ) 1000 MG tablet TAKE ONE TABLET BY MOUTH TWICE DAILY WITH MEALS   OVER THE COUNTER MEDICATION Take 1 tablet by mouth daily. Mega Red Joint   potassium chloride  SA (KLOR-CON  M) 20 MEQ tablet Take 1 tablet (20 mEq total) by mouth daily.   No facility-administered encounter medications on file as of 07/10/2023.    Allergies (verified) Patient has no known allergies.   History: Past Medical History:  Diagnosis Date   Allergy    Arthritis    ?knees,hips    Cancer (HCC)    prostate- robotic surgery, 2010.   Diabetes mellitus    diagnosed 2008   Hemorrhoids    h/o   Hypercholesteremia    Hypertension    Past Surgical History:  Procedure Laterality Date   ABDOMINAL HERNIA REPAIR  02/22/11  COLONOSCOPY  2018   HERNIA REPAIR  02/22/11   vental hernia   PROSTATECTOMY  October 15th, 2010   VENTRAL HERNIA REPAIR  02/22/2011   Procedure: LAPAROSCOPIC VENTRAL HERNIA;  Surgeon: Mayme Spearman, MD;  Location: [facility] OR;  Service: General;  Laterality: N/A;  laparoscopic ventral hernia repair with mesh   Family History  Problem Relation Age of Onset   Hypertension Mother    Diabetes Father    Hypertension Father    Anesthesia problems Neg Hx    Hypotension Neg Hx    Malignant hyperthermia Neg Hx     Pseudochol deficiency Neg Hx    Colon cancer Neg Hx    Colon polyps Neg Hx    Esophageal cancer Neg Hx    Rectal cancer Neg Hx    Stomach cancer Neg Hx    Social History   Socioeconomic History   Marital status: Married    Spouse name: Not on file   Number of children: Not on file   Years of education: Not on file   Highest education level: Not on file  Occupational History   Not on file  Tobacco Use   Smoking status: Never   Smokeless tobacco: Never  Vaping Use   Vaping status: Never Used  Substance and Sexual Activity   Alcohol use: Yes    Alcohol/week: 7.0 standard drinks of alcohol    Types: 7 Glasses of wine per week   Drug use: No   Sexual activity: Never  Other Topics Concern   Not on file  Social History Narrative   Not on file   Social Drivers of Health   Financial Resource Strain: Low Risk  (07/10/2023)   Overall Financial Resource Strain (CARDIA)    Difficulty of Paying Living Expenses: Not hard at all  Food Insecurity: No Food Insecurity (07/10/2023)   Hunger Vital Sign    Worried About Running Out of Food in the Last Year: Never true    Ran Out of Food in the Last Year: Never true  Transportation Needs: No Transportation Needs (07/10/2023)   PRAPARE - Administrator, Civil Service (Medical): No    Lack of Transportation (Non-Medical): No  Physical Activity: Insufficiently Active (07/10/2023)   Exercise Vital Sign    Days of Exercise per Week: 3 days    Minutes of Exercise per Session: 30 min  Stress: No Stress Concern Present (07/10/2023)   [employer] of Occupational Health - Occupational Stress Questionnaire    Feeling of Stress : Not at all  Social Connections: Moderately Integrated (07/10/2023)   Social Connection and Isolation Panel [NHANES]    Frequency of Communication with Friends and Family: More than three times a week    Frequency of Social Gatherings with Friends and Family: Three times a week    Attends Religious Services:  More than 4 times per year    Active Member of Clubs or Organizations: No    Attends Banker Meetings: Never    Marital Status: Married    Tobacco Counseling Counseling given: Not Answered    Clinical Intake:  Pre-visit preparation completed: Yes  Pain : No/denies pain     Diabetes: Yes CBG done?: No Did pt. bring in CBG monitor from home?: No  Lab Results  Component Value Date   HGBA1C 6.3 01/15/2023   HGBA1C 6.3 07/07/2022   HGBA1C 8.1 (H) 04/07/2022     How often do you need to have someone help you  when you read instructions, pamphlets, or other written materials from your doctor or pharmacy?: 1 - Never  Interpreter Needed?: No  Information entered by :: Seabron Cypress LPN   Activities of Daily Living     07/10/2023   12:19 PM  In your present state of health, do you have any difficulty performing the following activities:  Hearing? 0  Vision? 0  Difficulty concentrating or making decisions? 0  Walking or climbing stairs? 0  Dressing or bathing? 0  Doing errands, shopping? 0  Preparing Food and eating ? N  Using the Toilet? N  In the past six months, have you accidently leaked urine? N  Do you have problems with loss of bowel control? N  Managing your Medications? N  Managing your Finances? N  Housekeeping or managing your Housekeeping? N    Patient Care Team: Jobe Mulder, DO as PCP - General (Family Medicine)  Indicate any recent Medical Services you may have received from other than [facility] providers in the past year (date may be approximate).     Assessment:   This is a routine wellness examination for [facility].  Hearing/Vision screen Hearing Screening - Comments:: Denies hearing difficulties   Vision Screening - Comments:: Wears rx glasses - up to date with routine eye exams with Dr. Dayton Evener    Goals Addressed             This Visit's Progress    Remain active and independent         Depression Screen      07/10/2023   12:20 PM 01/15/2023    8:52 AM 10/06/2021   10:40 AM 12/07/2020    9:44 AM 04/24/2017    9:03 AM 04/17/2016    9:27 AM 05/14/2014    8:51 AM  PHQ 2/9 Scores  PHQ - 2 Score 0 0 0 0 2 0 0  PHQ- 9 Score  0 0  2      Fall Risk     07/10/2023   12:22 PM 01/15/2023    8:51 AM 04/07/2022    7:25 AM 10/06/2021   10:39 AM 12/07/2020   10:01 AM  Fall Risk   Falls in the past year? 0 0 0 0 0  Number falls in past yr: 0 0 0 0 0  Injury with Fall? 0 0 0 0 0  Risk for fall due to : No Fall Risks  No Fall Risks No Fall Risks   Follow up Falls prevention discussed;Education provided;Falls evaluation completed Falls evaluation completed  Falls evaluation completed     MEDICARE RISK AT HOME:  Medicare Risk at Home Any stairs in or around the home?: No If so, are there any without handrails?: No Home free of loose throw rugs in walkways, pet beds, electrical cords, etc?: Yes Adequate lighting in your home to reduce risk of falls?: Yes Life alert?: No Use of a cane, walker or w/c?: No Grab bars in the bathroom?: Yes Shower chair or bench in shower?: No Elevated toilet seat or a handicapped toilet?: Yes  TIMED UP AND GO:  Was the test performed?  No  Cognitive Function: 6CIT completed        07/10/2023   12:22 PM  6CIT Screen  What Year? 0 points  What month? 0 points  What time? 0 points  Count back from 20 0 points  Months in reverse 0 points  Repeat phrase 0 points  Total Score 0 points    Immunizations  Immunization History  Administered Date(s) Administered   Fluad Trivalent(High Dose 65+) 01/15/2023   Influenza Split 02/23/2011, 12/27/2011   Influenza Whole 02/22/2009, 11/22/2009   Influenza,inj,Quad PF,6+ Mos 01/28/2013, 04/17/2016, 04/24/2017, 01/07/2019, 02/20/2020, 12/07/2020   Influenza-Unspecified 12/11/2013, 12/23/2014   Moderna Sars-Covid-2 Vaccination 05/23/2019, 06/23/2019   PFIZER(Purple Top)SARS-COV-2 Vaccination 02/16/2020   PNEUMOCOCCAL  CONJUGATE-20 10/06/2021   Pfizer Covid-19 Vaccine Bivalent Booster 40yrs & up 03/30/2021   Pneumococcal Polysaccharide-23 04/24/2017   Td 09/01/2008   Tdap 08/19/2019   Zoster Recombinant(Shingrix ) 11/16/2021, 03/15/2022    Screening Tests Health Maintenance  Topic Date Due   FOOT EXAM  04/08/2023   COVID-19 Vaccine (5 - 2024-25 season) 07/15/2023 (Originally 11/12/2022)   HEMOGLOBIN A1C  07/15/2023   OPHTHALMOLOGY EXAM  09/16/2023   INFLUENZA VACCINE  10/12/2023   Diabetic kidney evaluation - eGFR measurement  01/15/2024   Diabetic kidney evaluation - Urine ACR  01/15/2024   Medicare Annual Wellness (AWV)  07/09/2024   DTaP/Tdap/Td (3 - Td or Tdap) 08/18/2029   Colonoscopy  07/20/2030   Pneumonia Vaccine 67+ Years old  Completed   Hepatitis C Screening  Completed   Zoster Vaccines- Shingrix   Completed   HPV VACCINES  Aged Out   Meningococcal B Vaccine  Aged Out    Health Maintenance  Health Maintenance Due  Topic Date Due   FOOT EXAM  04/08/2023    Additional Screening:  Vision Screening: Recommended annual ophthalmology exams for early detection of glaucoma and other disorders of the eye.  Dental Screening: Recommended annual dental exams for proper oral hygiene  Community Resource Referral / Chronic Care Management: CRR required this visit?  No   CCM required this visit?  No     Plan:     I have personally reviewed and noted the following in the patient's chart:   Medical and social history Use of alcohol, tobacco or illicit drugs  Current medications and supplements including opioid prescriptions. Patient is not currently taking opioid prescriptions. Functional ability and status Nutritional status Physical activity Advanced directives List of other physicians Hospitalizations, surgeries, and ER visits in previous 12 months Vitals Screenings to include cognitive, depression, and falls Referrals and appointments  In addition, I have reviewed and  discussed with patient certain preventive protocols, quality metrics, and best practice recommendations. A written personalized care plan for preventive services as well as general preventive health recommendations were provided to patient.     Seabron Cypress [place], [place]   06/19/8117   After Visit Summary: (MyChart) Due to this being a telephonic visit, the after visit summary with patients personalized plan was offered to patient via MyChart   Notes: Nothing significant to report at this time.

## 2023-07-16 ENCOUNTER — Ambulatory Visit (INDEPENDENT_AMBULATORY_CARE_PROVIDER_SITE_OTHER): Payer: PPO | Admitting: Family Medicine

## 2023-07-16 ENCOUNTER — Encounter: Payer: Self-pay | Admitting: Family Medicine

## 2023-07-16 VITALS — BP 130/84 | HR 93 | Temp 98.0°F | Resp 16 | Ht 68.0 in | Wt 220.0 lb

## 2023-07-16 DIAGNOSIS — Z7984 Long term (current) use of oral hypoglycemic drugs: Secondary | ICD-10-CM

## 2023-07-16 DIAGNOSIS — E119 Type 2 diabetes mellitus without complications: Secondary | ICD-10-CM

## 2023-07-16 DIAGNOSIS — I10 Essential (primary) hypertension: Secondary | ICD-10-CM | POA: Diagnosis not present

## 2023-07-16 LAB — COMPREHENSIVE METABOLIC PANEL WITH GFR
ALT: 9 U/L (ref 0–53)
AST: 19 U/L (ref 0–37)
Albumin: 4.6 g/dL (ref 3.5–5.2)
Alkaline Phosphatase: 60 U/L (ref 39–117)
BUN: 20 mg/dL (ref 6–23)
CO2: 32 meq/L (ref 19–32)
Calcium: 9.6 mg/dL (ref 8.4–10.5)
Chloride: 99 meq/L (ref 96–112)
Creatinine, Ser: 1.08 mg/dL (ref 0.40–1.50)
GFR: 71.25 mL/min (ref >60.00)
Glucose, Bld: 86 mg/dL (ref 70–99)
Potassium: 4 meq/L (ref 3.5–5.1)
Sodium: 141 meq/L (ref 135–145)
Total Bilirubin: 0.5 mg/dL (ref 0.2–1.2)
Total Protein: 7.7 g/dL (ref 6.0–8.3)

## 2023-07-16 LAB — LIPID PANEL
Cholesterol: 157 mg/dL (ref 0–200)
HDL: 39.4 mg/dL (ref >39.00)
LDL Cholesterol: 98 mg/dL (ref 0–99)
NonHDL: 117.46
Total CHOL/HDL Ratio: 4
Triglycerides: 99 mg/dL (ref 0.0–149.0)
VLDL: 19.8 mg/dL (ref 0.0–40.0)

## 2023-07-16 LAB — HEMOGLOBIN A1C: Hgb A1c MFr Bld: 6.4 % (ref 4.6–6.5)

## 2023-07-16 NOTE — Progress Notes (Signed)
 Subjective:   Chief Complaint  Patient presents with   Follow-up    Follow up    Kenneth [name] is a 68 y.o. male here for follow-up of diabetes.   Kenneth [name]'s self monitored glucose range is 70-100's.  Patient denies hypoglycemic reactions. He checks his glucose levels every other day.  Patient does not require insulin .   Medications include: metformin  1000 mg bid, glyburide  5 mg daily Diet is OK.  Exercise: active in yard and with granddaughter  Hypertension Patient presents for hypertension follow up. He does monitor home blood pressures. Blood pressures ranging on average from 120's/70's. He is compliant with medications- Norvasc  10 mg/d, Hyzaar 100-25 mg/d. Patient has these side effects of medication: none Diet/exercise as above. No CP or SOB.  Past Medical History:  Diagnosis Date   Allergy    Arthritis    ?knees,hips    Cancer [facility])    prostate- robotic surgery, 2010.   Diabetes mellitus    diagnosed 2008   Hemorrhoids    h/o   Hypercholesteremia    Hypertension      Related testing: Retinal exam: Done Pneumovax: done  Objective:  BP 130/84 (BP Location: Left Arm, Patient Position: Sitting)   Pulse 93   Temp 98 F (36.7 C) (Oral)   Resp 16   Ht 5\' 8"  (1.727 m)   Wt 220 lb (99.8 kg)   SpO2 98%   BMI 33.45 kg/m  General:  Well developed, well nourished, in no apparent distress Skin:  Warm, no pallor or diaphoresis Head:  Normocephalic, atraumatic Eyes:  Pupils equal and round, sclera anicteric without injection  Lungs:  CTAB, no access msc use Cardio:  RRR, no bruits, no LE edema Musculoskeletal:  Symmetrical muscle groups noted without atrophy or deformity Neuro:  Sensation intact to pinprick on feet Psych: Age appropriate judgment and insight  Assessment:   Diabetes mellitus treated with oral medication (HCC) - Plan: Comprehensive metabolic panel with GFR, Lipid panel, Hemoglobin A1c  Essential hypertension   Plan:   Chronic, stable.  Cont metformin  1000 mg bid, glyburide  5 mg/d. Counseled on diet and exercise. Chronic, stable. Cont Norvasc  10 mg/d, Hyzaar 100 mg/d.  F/u in 6 mo. The patient voiced understanding and agreement to the plan.  Shellie Dials [place], DO 07/16/23 9:35 AM

## 2023-07-16 NOTE — Patient Instructions (Signed)
 Give [place] 2-3 business days to get the results of your labs back.   Keep the diet clean and stay active.  Let us know if you need anything.

## 2023-08-08 ENCOUNTER — Other Ambulatory Visit: Payer: Self-pay | Admitting: Family Medicine

## 2023-08-08 DIAGNOSIS — I10 Essential (primary) hypertension: Secondary | ICD-10-CM

## 2023-08-31 ENCOUNTER — Other Ambulatory Visit: Payer: Self-pay | Admitting: Family Medicine

## 2023-09-17 ENCOUNTER — Other Ambulatory Visit: Payer: Self-pay | Admitting: Pharmacist

## 2023-09-17 ENCOUNTER — Encounter: Payer: Self-pay | Admitting: Pharmacist

## 2023-09-17 MED ORDER — GLYBURIDE 5 MG PO TABS: 5.0000 mg | ORAL_TABLET | Freq: Every day | ORAL | 0 refills | Status: DC

## 2023-09-17 MED ORDER — ATORVASTATIN CALCIUM 40 MG PO TABS: 40.0000 mg | ORAL_TABLET | Freq: Every day | ORAL | 0 refills | Status: DC

## 2023-09-17 MED ORDER — METFORMIN HCL 1000 MG PO TABS: 1000.0000 mg | ORAL_TABLET | Freq: Two times a day (BID) | ORAL | 0 refills | Status: DC

## 2023-09-17 NOTE — Progress Notes (Signed)
 Pharmacy Quality Measure Review  This patient is appearing on a report for being at risk of failing the adherence measure for cholesterol (statin), diabetes, and hypertension (ACEi/ARB) medications this calendar year. He also failed SUPD last year because his pharmacy was filling atorvastatin  off insurance.  I was not able to see patient's refill history in Med Adherence database but I called [facility] and received the information below. They report they are filling on his HTA plan - cost to him is $0.   Medication: atorvastatin   Last fill date: 04/27/2023 for 90 day supply - no refill remaining  Medication: losartan /HCTZ Last fill date: 08/08/2023 for 90 day supply- has 1 refill remaining  Medication: glyburide   Last fill date: 08/31/2023 for 90 day supply - no refill remaining  Medication: metformin   Last fill date: 07/02/2023 for 90 day supply - no refill remaining   Plan:  Spoke with patient and discussed barriers to adherence - needed update Rx's Contacted pharmacy to facilitate refills - updated Rx's for atorvastatin , glyburide  and metformin  to last until he is due to see PCP.  Will continue to follow adherence (based on refill reports he has missed 52 out of 64 days allowed for 2025 for atorvastatin )   Madelin Ray, PharmD Clinical Pharmacist [facility] Primary Care [facility]

## 2023-10-12 ENCOUNTER — Encounter: Payer: Self-pay | Admitting: Pharmacist

## 2023-10-12 NOTE — Progress Notes (Signed)
 Pharmacy Quality Measure Review  Following patient in 2205 due to failing MAC in 2025. He uses [facility] and in past they have not always used his [employer] plan to fill prescription. Education provided in 2024 to patient that all of his medications are lowest tier and should not have a copay .   His refill history is not available in Dr first database so called [facility] to confirm refill dates below.   Medication: rosuvastatin   Last fill date: 09/17/2023 for 90 day supply  Medication: metformin   Last fill date: 09/17/2023 for 90 day supply  Medication: glipizide Last fill date: 08/31/2023 for 90 day supply  Medication: losartan  Last fill date: 08/08/2023 for 90 day supply  Insurance report was not up to date. No action needed at this time.   Madelin Ray, PharmD Clinical Pharmacist [facility] Primary Care  Population Health [phone]

## 2023-11-01 ENCOUNTER — Other Ambulatory Visit: Payer: Self-pay | Admitting: Family Medicine

## 2023-12-14 ENCOUNTER — Telehealth: Payer: Self-pay | Admitting: Pharmacist

## 2023-12-14 MED ORDER — ATORVASTATIN CALCIUM 40 MG PO TABS: 40.0000 mg | ORAL_TABLET | Freq: Every day | ORAL | 0 refills | Status: DC

## 2023-12-14 MED ORDER — METFORMIN HCL 1000 MG PO TABS: 1000.0000 mg | ORAL_TABLET | Freq: Two times a day (BID) | ORAL | 0 refills | Status: DC

## 2023-12-14 MED ORDER — GLYBURIDE 5 MG PO TABS: 5.0000 mg | ORAL_TABLET | Freq: Every day | ORAL | 0 refills | Status: DC

## 2023-12-14 NOTE — Telephone Encounter (Signed)
 Pharmacy Quality Measure Review  Following patient in 2025 due to failing MAC in 2025. He uses [facility] and in past they have not always used his [employer] plan to fill prescription. Education provided in 2024 to patient that all of his medications are lowest tier and should not have a copay .   Reviewed recent refill history  Medication: rosuvastatin   Last fill date: 09/17/2023 for 90 day supply - no refills remaining  Medication: metformin   Last fill date: 09/17/2023 for 90 day supply - no refills remaining  Medication: glipizide Last fill date: 08/31/2023 for 90 day supply - no refills remaining  Medication: losartan  Last fill date: 10/31/2023 for 90 day supply - no refills remaining.   Patient last saw PCP 07/2023 and was recommended to follow up in 6 months.   Spoke with patient today and discussed medications listed above. He needed refills for rosuvastatin , metformin  and glipizide. Updated Rx for #90 / 0 Refills.  Scheduled appt with PCP for 01/16/2024.   Meds ordered this encounter  Medications   metFORMIN  (GLUCOPHAGE ) 1000 MG tablet    Sig: Take 1 tablet (1,000 mg total) by mouth 2 (two) times daily with a meal.    Dispense:  180 tablet    Refill:  0   atorvastatin  (LIPITOR) 40 MG tablet    Sig: Take 1 tablet (40 mg total) by mouth daily.    Dispense:  90 tablet    Refill:  0   glyBURIDE  (DIABETA ) 5 MG tablet    Sig: Take 1 tablet (5 mg total) by mouth daily with breakfast.    Dispense:  90 tablet    Refill:  0     Madelin Ray, PharmD Clinical Pharmacist [facility] Primary Care  Population Health [phone]

## 2023-12-25 ENCOUNTER — Telehealth: Payer: Self-pay | Admitting: Pharmacist

## 2023-12-25 NOTE — Progress Notes (Signed)
 Pharmacy Quality Measure Review  Following patient in 2025 due to failing MAC in 2025. He uses [facility] and in past they have not always used his [employer] plan to fill prescription. Education provided in 2024 to patient that all of his medications are lowest tier and should not have a copay .  He is also showing on HTA's recent adherence report as being past due to fill rosuvastatin   I sent in updated Rx for rosuvastatin  on 12/14/2023 and spoke with patient.  Called [facility] today and verified patient did pick up rosuvastatin  that was filled 12/14/2023 for 90 days and it was run thru his HTA plan with $0 copay.    No additional intervention needed. Adherence report was not up to date.    Madelin Ray, PharmD Clinical Pharmacist [facility] Primary Care  Population Health [phone]

## 2024-01-11 NOTE — Progress Notes (Signed)
 RIHAN SCHUELER                                          MRN: [id number]   01/11/2024   The [facility] Quality Team Specialist reviewed this patient medical record for the purposes of chart review for care gap closure. The following were reviewed: chart review for care gap closure-kidney health evaluation for diabetes:eGFR  and uACR. APPT 11/5    [facility] Quality Team

## 2024-01-16 ENCOUNTER — Ambulatory Visit: Payer: Self-pay | Admitting: Family Medicine

## 2024-01-16 ENCOUNTER — Encounter: Payer: Self-pay | Admitting: Family Medicine

## 2024-01-16 ENCOUNTER — Ambulatory Visit: Admitting: Family Medicine

## 2024-01-16 VITALS — BP 128/76 | HR 92 | Temp 98.0°F | Resp 16 | Ht 68.0 in | Wt 220.2 lb

## 2024-01-16 DIAGNOSIS — Z23 Encounter for immunization: Secondary | ICD-10-CM | POA: Diagnosis not present

## 2024-01-16 DIAGNOSIS — Z0000 Encounter for general adult medical examination without abnormal findings: Secondary | ICD-10-CM | POA: Diagnosis not present

## 2024-01-16 DIAGNOSIS — E119 Type 2 diabetes mellitus without complications: Secondary | ICD-10-CM | POA: Diagnosis not present

## 2024-01-16 DIAGNOSIS — Z7984 Long term (current) use of oral hypoglycemic drugs: Secondary | ICD-10-CM

## 2024-01-16 DIAGNOSIS — J302 Other seasonal allergic rhinitis: Secondary | ICD-10-CM | POA: Diagnosis not present

## 2024-01-16 DIAGNOSIS — I10 Essential (primary) hypertension: Secondary | ICD-10-CM

## 2024-01-16 LAB — COMPREHENSIVE METABOLIC PANEL WITH GFR
ALT: 11 U/L (ref 0–53)
AST: 20 U/L (ref 0–37)
Albumin: 4.5 g/dL (ref 3.5–5.2)
Alkaline Phosphatase: 56 U/L (ref 39–117)
BUN: 18 mg/dL (ref 6–23)
CO2: 33 meq/L — ABNORMAL HIGH (ref 19–32)
Calcium: 9.2 mg/dL (ref 8.4–10.5)
Chloride: 101 meq/L (ref 96–112)
Creatinine, Ser: 1.16 mg/dL (ref 0.40–1.50)
GFR: 65.17 mL/min (ref >60.00)
Glucose, Bld: 95 mg/dL (ref 70–99)
Potassium: 3.7 meq/L (ref 3.5–5.1)
Sodium: 142 meq/L (ref 135–145)
Total Bilirubin: 0.5 mg/dL (ref 0.2–1.2)
Total Protein: 7.7 g/dL (ref 6.0–8.3)

## 2024-01-16 LAB — CBC
HCT: 38.3 % — ABNORMAL LOW (ref 39.0–52.0)
Hemoglobin: 12.7 g/dL — ABNORMAL LOW (ref 13.0–17.0)
MCHC: 33 g/dL (ref 30.0–36.0)
MCV: 84.3 fl (ref 78.0–100.0)
Platelets: 292 K/uL (ref 150.0–400.0)
RBC: 4.55 Mil/uL (ref 4.22–5.81)
RDW: 15.5 % (ref 11.5–15.5)
WBC: 3.8 K/uL — ABNORMAL LOW (ref 4.0–10.5)

## 2024-01-16 LAB — LIPID PANEL
Cholesterol: 136 mg/dL (ref 0–200)
HDL: 35.9 mg/dL — ABNORMAL LOW (ref >39.00)
LDL Cholesterol: 80 mg/dL (ref 0–99)
NonHDL: 100.34
Total CHOL/HDL Ratio: 4
Triglycerides: 103 mg/dL (ref 0.0–149.0)
VLDL: 20.6 mg/dL (ref 0.0–40.0)

## 2024-01-16 LAB — MICROALBUMIN / CREATININE URINE RATIO
Creatinine,U: 124.7 mg/dL
Microalb Creat Ratio: 22 mg/g (ref 0.0–30.0)
Microalb, Ur: 2.7 mg/dL — ABNORMAL HIGH (ref 0.0–1.9)

## 2024-01-16 LAB — HEMOGLOBIN A1C: Hgb A1c MFr Bld: 6.6 % — ABNORMAL HIGH (ref 4.6–6.5)

## 2024-01-16 MED ORDER — LOSARTAN POTASSIUM-HCTZ 100-25 MG PO TABS: 1.0000 | ORAL_TABLET | Freq: Every day | ORAL | 1 refills | Status: AC

## 2024-01-16 MED ORDER — MONTELUKAST SODIUM 10 MG PO TABS
10.0000 mg | ORAL_TABLET | Freq: Every day | ORAL | 3 refills | Status: AC | PRN
Start: 2024-01-16 — End: ?

## 2024-01-16 MED ORDER — POTASSIUM CHLORIDE CRYS ER 20 MEQ PO TBCR: 20.0000 meq | EXTENDED_RELEASE_TABLET | Freq: Every day | ORAL | 0 refills | Status: AC

## 2024-01-16 MED ORDER — AMLODIPINE BESYLATE 10 MG PO TABS: 10.0000 mg | ORAL_TABLET | Freq: Every day | ORAL | 1 refills | Status: AC

## 2024-01-16 NOTE — Patient Instructions (Addendum)
 Give [place] 2-3 business days to get the results of your labs back.   Keep the diet clean and stay active.  Please get me a copy of your advanced directive form at your convenience.   Foods that may reduce pain: 1) Ginger 2) Blueberries 3) Salmon 4) Pumpkin seeds 5) Dark chocolate 6) Turmeric 7) Tart cherries 8) Virgin olive oil 9) Chili peppers 10) Mint 11) Krill oil  Let us know if you need anything.

## 2024-01-16 NOTE — Progress Notes (Signed)
 Chief Complaint  Patient presents with   Annual Exam    CPE    Well Male Kenneth [name] is here for a complete physical.   His last physical was >1 year ago.  Current diet: in general, a healthy diet.   Current exercise: walking Weight trend: stable Fatigue out of ordinary? Yes- from allergies Seat belt? Yes.   Advanced directive? No  Health maintenance Shingrix - Yes Colonoscopy- Yes Tetanus- Yes Hep C- Yes Pneumonia vaccine- Yes  Past Medical History:  Diagnosis Date   Allergy    Arthritis    ?knees,hips    Cancer (HCC)    prostate- robotic surgery, 2010.   Diabetes mellitus    diagnosed 2008   Hemorrhoids    h/o   Hypercholesteremia    Hypertension      Past Surgical History:  Procedure Laterality Date   ABDOMINAL HERNIA REPAIR  02/22/11   COLONOSCOPY  2018   HERNIA REPAIR  02/22/11   vental hernia   PROSTATECTOMY  October 15th, 2010   VENTRAL HERNIA REPAIR  02/22/2011   Procedure: LAPAROSCOPIC VENTRAL HERNIA;  Surgeon: Deward GORMAN Null III, MD;  Location: [facility] OR;  Service: General;  Laterality: N/A;  laparoscopic ventral hernia repair with mesh    Medications  Current Outpatient Medications on File Prior to Visit  Medication Sig Dispense Refill   atorvastatin  (LIPITOR) 40 MG tablet Take 1 tablet (40 mg total) by mouth daily. 90 tablet 0   Blood Glucose Monitoring Suppl (CONTOUR NEXT MONITOR) w/Device KIT Use daily to check blood sugar.  DX E11.65 1 kit 0   fluticasone  (FLONASE ) 50 MCG/ACT nasal spray Place 2 sprays into both nostrils daily. 48 g 1   glucose blood (CONTOUR NEXT TEST) test strip Use daily to check blood sugar.  DX E11.65 100 each 3   glyBURIDE  (DIABETA ) 5 MG tablet Take 1 tablet (5 mg total) by mouth daily with breakfast. 90 tablet 0   metFORMIN  (GLUCOPHAGE ) 1000 MG tablet Take 1 tablet (1,000 mg total) by mouth 2 (two) times daily with a meal. 180 tablet 0   OVER THE COUNTER MEDICATION Take 1 tablet by mouth daily. Mega Red Joint     No  current facility-administered medications on file prior to visit.     Allergies No Known Allergies  Family History Family History  Problem Relation Age of Onset   Hypertension Mother    Diabetes Father    Hypertension Father    Anesthesia problems Neg Hx    Hypotension Neg Hx    Malignant hyperthermia Neg Hx    Pseudochol deficiency Neg Hx    Colon cancer Neg Hx    Colon polyps Neg Hx    Esophageal cancer Neg Hx    Rectal cancer Neg Hx    Stomach cancer Neg Hx     Review of Systems: Constitutional:  no fevers Eye:  no recent significant change in vision Ears:  No changes in hearing Nose/Mouth/Throat:  no complaints of nasal congestion, no sore throat Cardiovascular: no chest pain Respiratory:  No shortness of breath Gastrointestinal:  No change in bowel habits GU:  No frequency Integumentary:  no abnormal skin lesions reported Neurologic:  no headaches Endocrine:  denies unexplained weight changes  Exam BP 128/76 (BP Location: Left Arm, Patient Position: Sitting)   Pulse 92   Temp 98 F (36.7 C) (Oral)   Resp 16   Ht 5' 8 (1.727 m)   Wt 220 lb 3.2 oz (99.9 kg)  SpO2 98%   BMI 33.48 kg/m  General:  well developed, well nourished, in no apparent distress Skin:  no significant moles, warts, or growths Head:  no masses, lesions, or tenderness Eyes:  pupils equal and round, sclera anicteric without injection Ears:  canals without lesions, TMs shiny without retraction, no obvious effusion, no erythema Nose:  nares patent, mucosa normal Throat/Pharynx:  lips and gingiva without lesion; tongue and uvula midline; non-inflamed pharynx; no exudates or postnasal drainage Lungs:  clear to auscultation, breath sounds equal bilaterally, no respiratory distress Cardio:  regular rate and rhythm, no LE edema or bruits Rectal: Deferred GI: BS+, S, NT, ND, no masses or organomegaly Musculoskeletal:  symmetrical muscle groups noted without atrophy or deformity Neuro:  gait  normal; deep tendon reflexes normal and symmetric Psych: well oriented with normal range of affect and appropriate judgment/insight  Assessment and Plan  Well adult exam  Need for influenza vaccination - Plan: Flu vaccine HIGH DOSE PF(Fluzone Trivalent)  Essential hypertension - Plan: losartan -hydrochlorothiazide  (HYZAAR) 100-25 MG tablet, amLODipine  (NORVASC ) 10 MG tablet  Seasonal allergies - Plan: montelukast (SINGULAIR) 10 MG tablet  Diabetes mellitus treated with oral medication (HCC) - Plan: Comprehensive metabolic panel with GFR, CBC, Lipid panel, Hemoglobin A1c, Microalbumin / creatinine urine ratio   Well 68 y.o. male. Counseled on diet and exercise. Advanced directive form provided today.  Flu shot today.  Other orders as above. Follow up in 6 mo.  The patient voiced understanding and agreement to the plan.  Mabel Mt Schoolcraft, DO 01/16/24 8:21 AM

## 2024-01-30 ENCOUNTER — Encounter: Payer: Self-pay | Admitting: Pharmacist

## 2024-01-30 NOTE — Progress Notes (Signed)
  Pharmacy Quality Measure Review  Following patient in 2025 due to failing MAC in 2025. He uses [facility] and in past they have not always used his [employer] plan to fill prescription. Education provided in 2024 to patient that all of his medications are lowest tier and should not have a copay .  He is also showing on HTA's recent adherence report as being past due to fill rosuvastatin   Updated Rx for rosuvastatin  was sent in on 12/14/2023 and spoke with patient.  Called [facility] today and verified patient did pick up rosuvastatin  that was filled 12/14/2023 for 90 days and it was run thru his HTA plan with $0 copay.    No additional intervention needed. Adherence report was not up to date.    Madelin Ray, PharmD Clinical Pharmacist [facility]  Population Health [phone]

## 2024-02-08 NOTE — Progress Notes (Signed)
 Kenneth [name]                                          MRN: [id number]   02/08/2024   The [facility] Quality Team Specialist reviewed this patient medical record for the purposes of chart review for care gap closure. The following were reviewed: abstraction for care gap closure-kidney health evaluation for diabetes:eGFR  and uACR.    [facility] Quality Team

## 2024-03-16 ENCOUNTER — Other Ambulatory Visit: Payer: Self-pay | Admitting: Family Medicine

## 2024-03-17 ENCOUNTER — Encounter: Payer: Self-pay | Admitting: Emergency Medicine

## 2024-03-17 ENCOUNTER — Ambulatory Visit
Admission: EM | Admit: 2024-03-17 | Discharge: 2024-03-17 | Disposition: A | Discharge status: Home / Self Care | Attending: Family Medicine | Admitting: Family Medicine

## 2024-03-17 DIAGNOSIS — J329 Chronic sinusitis, unspecified: Secondary | ICD-10-CM | POA: Diagnosis not present

## 2024-03-17 DIAGNOSIS — B9689 Other specified bacterial agents as the cause of diseases classified elsewhere: Secondary | ICD-10-CM | POA: Diagnosis not present

## 2024-03-17 MED ORDER — AMOXICILLIN-POT CLAVULANATE 875-125 MG PO TABS: 1.0000 | ORAL_TABLET | Freq: Two times a day (BID) | ORAL | 0 refills | Status: AC

## 2024-03-17 MED ORDER — AMOXICILLIN-POT CLAVULANATE 875-125 MG PO TABS: 1.0000 | ORAL_TABLET | Freq: Two times a day (BID) | ORAL | 0 refills | Status: DC

## 2024-03-17 NOTE — Discharge Instructions (Addendum)
 Start Augmentin  antibiotic twice daily for 7 days.  Continue Flonase  daily.  Nasal rinses as tolerated.  Lots of rest and fluids.  Please follow-up with your PCP in 2 to 3 days for recheck.  Please go to the ER for any worsening symptoms.  Hope you feel better soon!

## 2024-03-17 NOTE — ED Provider Notes (Addendum)
 " [facility]    CSN: [id number] Arrival date & time: 03/17/24  9075      History   Chief Complaint Chief Complaint  Patient presents with   Cough   Facial Pain    HPI KIP CROPP is a 69 y.o. male  presents for evaluation of URI symptoms for 1.5 weeks. Patient reports associated symptoms of sinus pressure/pain with purulent nasal discharge with postnasal drip causing cough. Denies N/V/D, fevers, sore throat, ear pain, body aches, shortness of breath. Patient does not have a hx of asthma. Patient is not an active smoker.   Reports sick contacts at home.  Also reports he thinks he has a fishbone stuck in his throat x 1 week.  States he is able to eat and drink normally.  Pt has taken Flonase  OTC for symptoms. Pt has no other concerns at this time.    Cough   Past Medical History:  Diagnosis Date   Allergy    Arthritis    ?knees,hips    Cancer [facility])    prostate- robotic surgery, 2010.   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/19/2019   History of prostate cancer 05/19/2019   Ventral hernia 01/25/2011   Diabetes mellitus treated with oral medication (HCC) 08/24/2009   VENTRAL HERNIA 08/24/2009   LOW BACK PAIN 08/24/2009   BACK PAIN 06/08/2009   PROSTATE SPECIFIC ANTIGEN, ELEVATED 09/01/2008   RECTAL BLEEDING, HX OF 05/04/2008   GOUT 04/11/2007   Dyslipidemia 02/15/2007   Essential hypertension 02/15/2007   ALLERGIC RHINITIS 02/15/2007   DIVERTICULOSIS, COLON 02/15/2007    Past Surgical History:  Procedure Laterality Date   ABDOMINAL HERNIA REPAIR  02/22/11   COLONOSCOPY  2018   HERNIA REPAIR  02/22/11   vental hernia   PROSTATECTOMY  October 15th, 2010   VENTRAL HERNIA REPAIR  02/22/2011   Procedure: LAPAROSCOPIC VENTRAL HERNIA;  Surgeon: Deward GORMAN Curvin DOUGLAS, MD;  Location: [facility] OR;  Service:  General;  Laterality: N/A;  laparoscopic ventral hernia repair with mesh       Home Medications    Prior to Admission medications  Medication Sig Start Date End Date Taking? Authorizing Provider  amoxicillin -clavulanate (AUGMENTIN ) 875-125 MG tablet Take 1 tablet by mouth every 12 (twelve) hours. 03/17/24  Yes Brennden Masten, Jodi R, NP  amLODipine  (NORVASC ) 10 MG tablet Take 1 tablet (10 mg total) by mouth daily. 01/16/24   Frann Mabel Deward, DO  atorvastatin  (LIPITOR) 40 MG tablet Take 1 tablet (40 mg total) by mouth daily. 03/17/24   Frann Mabel Deward, DO  Blood Glucose Monitoring Suppl (CONTOUR NEXT MONITOR) w/Device KIT Use daily to check blood sugar.  DX E11.65 07/10/22   Frann Mabel Deward, DO  fluticasone  (FLONASE ) 50 MCG/ACT nasal spray Place 2 sprays into both nostrils daily. 08/08/23   Frann Mabel Deward, DO  glucose blood (CONTOUR NEXT TEST) test strip Use daily to check blood sugar.  DX E11.65 07/10/22   Frann Mabel Deward, DO  glyBURIDE  (DIABETA ) 5 MG tablet Take 1 tablet (5 mg total) by mouth daily with breakfast. 03/17/24   Frann Mabel Deward, DO  losartan -hydrochlorothiazide  (HYZAAR) 100-25 MG tablet Take 1 tablet by mouth daily. 01/16/24   Frann Mabel Deward, DO  metFORMIN  (GLUCOPHAGE ) 1000 MG tablet Take 1 tablet (1,000 mg  total) by mouth 2 (two) times daily with a meal. 03/17/24   Wendling, Mabel Mt, DO  montelukast  (SINGULAIR ) 10 MG tablet Take 1 tablet (10 mg total) by mouth daily as needed (Allergies). 01/16/24   Frann Mabel Mt, DO  OVER THE COUNTER MEDICATION Take 1 tablet by mouth daily. Mega Red Joint    [provider]  potassium chloride  SA (KLOR-CON  M) 20 MEQ tablet Take 1 tablet (20 mEq total) by mouth daily. 01/16/24   Frann Mabel Mt, DO    Family History Family History  Problem Relation Age of Onset   Hypertension Mother    Diabetes Father    Hypertension Father    Anesthesia problems Neg Hx    Hypotension Neg  Hx    Malignant hyperthermia Neg Hx    Pseudochol deficiency Neg Hx    Colon cancer Neg Hx    Colon polyps Neg Hx    Esophageal cancer Neg Hx    Rectal cancer Neg Hx    Stomach cancer Neg Hx     Social History Social History[1]   Allergies   Patient has no known allergies.   Review of Systems Review of Systems  HENT:  Positive for congestion, sinus pressure and sinus pain.   Respiratory:  Positive for cough.      Physical Exam Triage Vital Signs ED Triage Vitals  Encounter Vitals Group     BP 03/17/24 1012 132/87     Girls Systolic BP Percentile --      Girls Diastolic BP Percentile --      Boys Systolic BP Percentile --      Boys Diastolic BP Percentile --      Pulse Rate 03/17/24 1012 91     Resp 03/17/24 1012 16     Temp 03/17/24 1012 98.4 F (36.9 C)     Temp Source 03/17/24 1012 Oral     SpO2 03/17/24 1012 97 %     Weight 03/17/24 1013 215 lb (97.5 kg)     Height 03/17/24 1013 5' 7 (1.702 m)     Head Circumference --      Peak Flow --      Pain Score 03/17/24 1013 0     Pain Loc --      Pain Education --      Exclude from Growth Chart --    No data found.  Updated Vital Signs BP 132/87 (BP Location: Right Arm)   Pulse 91   Temp 98.4 F (36.9 C) (Oral)   Resp 16   Ht 5' 7 (1.702 m)   Wt 215 lb (97.5 kg)   SpO2 97%   BMI 33.67 kg/m   Visual Acuity Right Eye Distance:   Left Eye Distance:   Bilateral Distance:    Right Eye Near:   Left Eye Near:    Bilateral Near:     Physical Exam Vitals and nursing note reviewed.  Constitutional:      General: He is not in acute distress.    Appearance: Normal appearance. He is not ill-appearing or toxic-appearing.  HENT:     Head: Normocephalic and atraumatic.     Right Ear: Tympanic membrane and ear canal normal.     Left Ear: Tympanic membrane and ear canal normal.     Nose: Congestion present.     Right Turbinates: Swollen and pale.     Left Turbinates: Swollen and pale.     Right Sinus:  Maxillary sinus tenderness present.  Left Sinus: Maxillary sinus tenderness present.     Mouth/Throat:     Mouth: Mucous membranes are moist.     Pharynx: No oropharyngeal exudate or posterior oropharyngeal erythema.     Comments: No visible foreign body Eyes:     Pupils: Pupils are equal, round, and reactive to light.  Cardiovascular:     Rate and Rhythm: Normal rate and regular rhythm.     Heart sounds: Normal heart sounds.  Pulmonary:     Effort: Pulmonary effort is normal.     Breath sounds: Normal breath sounds. No wheezing, rhonchi or rales.  Musculoskeletal:     Cervical back: Normal range of motion and neck supple.  Lymphadenopathy:     Cervical: No cervical adenopathy.  Skin:    General: Skin is warm and dry.  Neurological:     General: No focal deficit present.     Mental Status: He is alert and oriented to person, place, and time.  Psychiatric:        Mood and Affect: Mood normal.        Behavior: Behavior normal.      UC Treatments / Results  Labs (all labs ordered are listed, but only abnormal results are displayed) Labs Reviewed - No data to display Comprehensive metabolic panel with GFR Order: [id number]  Status: Final result     Next appt: 07/15/2024 at 10:10 AM in Family Medicine ([facility] VISIT 2)     Dx: Diabetes mellitus treated with oral m...   Test Result Released: Yes (seen)     Messages: Seen   1 Result Note     1 Patient Communication     View Follow-Up Encounter     1 HM Topic          Component Ref Range & Units (hover) 2 mo ago (01/16/24) 8 mo ago (07/16/23) 1 yr ago (01/15/23) 1 yr ago (12/24/22) 1 yr ago (04/07/22) 2 yr ago (10/06/21) 3 yr ago (01/11/21)  Sodium 142 141 139 138 R 139 140 141  Potassium 3.7 4.0 4.1 3.4 Low  R 3.5 3.9 4.1  Chloride 101 99 100 97 Low  R 99 101 105  CO2 33 High  32 29 29 R 30 31 28   Comment: Elevated LDH levels may cause falsely increased CO2 results. If LDH is >2000 U/L, a positive  bias of 12% is possible.  Glucose, Bld 95 86 89 117 High  CM 155 High  125 High  122 High   BUN 18 20 20 17  R 17 14 17   Creatinine, Ser 1.16 1.08 1.12 1.02 R 1.01 0.93 0.99  Total Bilirubin 0.5 0.5 0.4 0.7 R 0.4 0.4   Alkaline Phosphatase 56 60 64 58 R 62 60   AST 20 19 18 21  R 17 18   ALT 11 9 11 10  R 11 10   Total Protein 7.7 7.7 7.9 8.3 High  R 7.8 7.8   Albumin 4.5 4.6 4.4 4.5 R 4.5 4.6   GFR 65.17 71.25 CM 68.45 CM  77.91 CM 86.33 CM 80.50 CM  Comment: Calculated using the CKD-EPI Creatinine Equation (2021)  Calcium  9.2 9.6 9.6 9.1 R 9.5 9.5 9.5  Resulting Agency [place] [facility] [place] [facility] [place] [facility] [place] [facility] [place] [facility]        Specimen Collected: 01/16/24 08:30 Last Resulted: 01/16/24 14:10   EKG   Radiology No results found.  Procedures Procedures (including critical care time)  Medications  Ordered in UC Medications - No data to display  Initial Impression / Assessment and Plan / UC Course  I have reviewed the triage vital signs and the nursing notes.  Pertinent labs & imaging results that were available during my care of the patient were reviewed by me and considered in my medical decision making (see chart for details).     Reviewed exam and symptoms with patient.  Will treat sinusitis with Augmentin  twice daily for 7 days.  He is to continue Flonase  and nasal rinses as tolerated.  Discussed unable to evaluate/remove foreign body from throat.  Discussed following up with PCP versus ER.  Patient states he will follow-up with PCP if he gets worse he will go to the ER.  I feel this is reasonable as he is able to eat and drink normally without any difficulty or pain.  PCP follow-up 2 to 3 days for recheck.  ER precautions reviewed. Final Clinical Impressions(s) / UC Diagnoses   Final diagnoses:  Bacterial sinusitis     Discharge Instructions      Start Augmentin  antibiotic twice daily for 7 days.  Continue Flonase   daily.  Nasal rinses as tolerated.  Lots of rest and fluids.  Please follow-up with your PCP in 2 to 3 days for recheck.  Please go to the ER for any worsening symptoms.  Hope you feel better soon!    ED Prescriptions     Medication Sig Dispense Auth. Provider   amoxicillin -clavulanate (AUGMENTIN ) 875-125 MG tablet Take 1 tablet by mouth every 12 (twelve) hours. 14 tablet Colby Catanese, Jodi R, NP      PDMP not reviewed this encounter.    Loreda Myla SAUNDERS, NP 03/17/24 1030     [1]  Social History Tobacco Use   Smoking status: Never   Smokeless tobacco: Never  Vaping Use   Vaping status: Never Used  Substance Use Topics   Alcohol use: Yes    Alcohol/week: 7.0 standard drinks of alcohol    Types: 7 Glasses of wine per week   Drug use: No     Loreda Myla SAUNDERS, NP 03/17/24 1030  "

## 2024-03-17 NOTE — ED Triage Notes (Signed)
 Pt c/o sinus drainage and congestion x's1 week  Also c/o nonproductive cough

## 2024-07-15 ENCOUNTER — Ambulatory Visit

## 2024-07-16 ENCOUNTER — Ambulatory Visit
